# Patient Record
Sex: Female | Born: 1995 | Race: Black or African American | Hispanic: No | Marital: Single | Smoking: Never smoker
Health system: Southern US, Community
[De-identification: ages and names within clinical notes are randomized; demographics above are authoritative.]

## PROBLEM LIST (undated history)

## (undated) DIAGNOSIS — R319 Hematuria, unspecified: Secondary | ICD-10-CM

## (undated) DIAGNOSIS — L309 Dermatitis, unspecified: Secondary | ICD-10-CM

## (undated) DIAGNOSIS — Z803 Family history of malignant neoplasm of breast: Secondary | ICD-10-CM

## (undated) DIAGNOSIS — J45909 Unspecified asthma, uncomplicated: Secondary | ICD-10-CM

## (undated) DIAGNOSIS — Z87442 Personal history of urinary calculi: Secondary | ICD-10-CM

## (undated) DIAGNOSIS — N200 Calculus of kidney: Secondary | ICD-10-CM

## (undated) DIAGNOSIS — R109 Unspecified abdominal pain: Secondary | ICD-10-CM

## (undated) DIAGNOSIS — A749 Chlamydial infection, unspecified: Secondary | ICD-10-CM

## (undated) DIAGNOSIS — D369 Benign neoplasm, unspecified site: Secondary | ICD-10-CM

## (undated) DIAGNOSIS — Z23 Encounter for immunization: Secondary | ICD-10-CM

## (undated) HISTORY — DX: Chlamydial infection, unspecified: A74.9

## (undated) HISTORY — DX: Hematuria, unspecified: R31.9

## (undated) HISTORY — PX: LITHOTRIPSY: SUR834

## (undated) HISTORY — DX: Unspecified asthma, uncomplicated: J45.909

## (undated) HISTORY — DX: Calculus of kidney: N20.0

## (undated) HISTORY — DX: Dermatitis, unspecified: L30.9

## (undated) HISTORY — DX: Benign neoplasm, unspecified site: D36.9

## (undated) HISTORY — DX: Unspecified abdominal pain: R10.9

## (undated) HISTORY — PX: TYMPANOSTOMY TUBE PLACEMENT: SHX32

## (undated) HISTORY — DX: Family history of malignant neoplasm of breast: Z80.3

## (undated) HISTORY — DX: Encounter for immunization: Z23

---
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
## 2016-05-16 ENCOUNTER — Ambulatory Visit (INDEPENDENT_AMBULATORY_CARE_PROVIDER_SITE_OTHER): Payer: 59

## 2016-05-16 ENCOUNTER — Ambulatory Visit (INDEPENDENT_AMBULATORY_CARE_PROVIDER_SITE_OTHER): Payer: 59 | Admitting: Obstetrics and Gynecology

## 2016-05-16 ENCOUNTER — Other Ambulatory Visit: Payer: Self-pay | Admitting: Obstetrics and Gynecology

## 2016-05-16 ENCOUNTER — Encounter: Payer: Self-pay | Admitting: Obstetrics and Gynecology

## 2016-05-16 VITALS — BP 120/80 | HR 90 | Ht 65.0 in | Wt 142.0 lb

## 2016-05-16 DIAGNOSIS — Z3042 Encounter for surveillance of injectable contraceptive: Secondary | ICD-10-CM

## 2016-05-16 DIAGNOSIS — Z113 Encounter for screening for infections with a predominantly sexual mode of transmission: Secondary | ICD-10-CM

## 2016-05-16 DIAGNOSIS — Z01419 Encounter for gynecological examination (general) (routine) without abnormal findings: Secondary | ICD-10-CM | POA: Diagnosis not present

## 2016-05-16 MED ORDER — MEDROXYPROGESTERONE ACETATE 150 MG/ML IM SUSY: 150.0000 mg | PREFILLED_SYRINGE | Freq: Once | INTRAMUSCULAR | 3 refills | Status: DC

## 2016-05-16 MED ORDER — MEDROXYPROGESTERONE ACETATE 150 MG/ML IM SUSP
150.0000 mg | Freq: Once | INTRAMUSCULAR | Status: AC
  Administered 2016-05-16: 150 mg via INTRAMUSCULAR

## 2016-05-16 NOTE — Progress Notes (Signed)
Chief Complaint  Patient presents with  . Gynecologic Exam     HPI:      Kristen Duarte is a 21 y.o. G0P0000 who LMP was Patient's last menstrual period was 05/12/2016., presents today for her annual examination.  Her menses are absent due to depo. Dysmenorrhea mild, occurring randomly. She does have intermenstrual bleeding.  Sex activity: single partner, contraception - Depo-Provera injections.  Hx of STDs: none  There is no FH of breast cancer. There is no FH of ovarian cancer. The patient does not do self-breast exams.  Tobacco use: The patient denies current or previous tobacco use. Alcohol use: none Exercise: not active  She does not get adequate calcium and Vitamin D in her diet.  She has been having issues with kidney stones this yr. They are calcium based.   Past Medical History:  Diagnosis Date  . Asthma   . Chlamydia   . Dermoid cyst   . Eczema   . Family history of breast cancer   . Flank pain   . Hematuria    gross  . History of kidney stones    H/O  . Immunization, viral disease 03/03/2012   gardasil series completed  . Kidney stones     Past Surgical History:  Procedure Laterality Date  . LITHOTRIPSY    . TONSILLECTOMY  2003  . TYMPANOSTOMY TUBE PLACEMENT      Family History  Problem Relation Age of Onset  . Kidney Stones Mother   . Hypertension Mother   . Kidney Stones Father   . Kidney Stones Sister   . Breast cancer Maternal Grandmother 107  . Hypertension Maternal Grandmother   . Brain cancer Maternal Grandfather   . Cancer Maternal Grandfather        lung ca - smoker  . Brain cancer Paternal Aunt   . Breast cancer Other   . Breast cancer Other   . Breast cancer Other   . Hypertension Maternal Aunt   . Kidney disease Neg Hx     Social History   Social History  . Marital status: Single    Spouse name: N/A  . Number of children: 0  . Years of education: 12   Occupational History  . Not on file.   Social History Main  Topics  . Smoking status: Never Smoker  . Smokeless tobacco: Never Used  . Alcohol use No  . Drug use: No  . Sexual activity: Yes    Birth control/ protection: Injection   Other Topics Concern  . Not on file   Social History Narrative  . No narrative on file     Current Outpatient Prescriptions:  .  albuterol (PROVENTIL HFA;VENTOLIN HFA) 108 (90 Base) MCG/ACT inhaler, Inhale 2 puffs into the lungs every 6 (six) hours as needed for wheezing or shortness of breath., Disp: , Rfl:  .  EPIDUO FORTE 0.3-2.5 % GEL, 1 Dose daily. , Disp: , Rfl:  .  MedroxyPROGESTERone Acetate 150 MG/ML SUSY, Inject 1 mL (150 mg total) into the muscle once., Disp: 1 Syringe, Rfl: 3  ROS:  Review of Systems  Constitutional: Negative for fever, malaise/fatigue and weight loss.  HENT: Negative for congestion, ear pain and sinus pain.   Respiratory: Negative for cough, shortness of breath and wheezing.   Cardiovascular: Negative for chest pain, orthopnea and leg swelling.  Gastrointestinal: Negative for constipation, diarrhea, nausea and vomiting.  Genitourinary: Negative for dysuria, frequency, hematuria and urgency.  Breast ROS: negative   Musculoskeletal: Negative for back pain, joint pain and myalgias.  Skin: Negative for itching and rash.  Neurological: Negative for dizziness, tingling, focal weakness and headaches.  Endo/Heme/Allergies: Negative for environmental allergies. Does not bruise/bleed easily.  Psychiatric/Behavioral: Negative for depression and suicidal ideas. The patient is not nervous/anxious and does not have insomnia.     Objective: BP 120/80   Pulse 90   Ht 5\' 5"  (1.651 m)   Wt 142 lb (64.4 kg)   LMP 05/12/2016   BMI 23.63 kg/m    Physical Exam  Constitutional: She is oriented to person, place, and time. She appears well-developed and well-nourished.  Genitourinary: Vagina normal and uterus normal. There is no rash or tenderness on the right labia. There is no rash or  tenderness on the left labia. No erythema or tenderness in the vagina. No vaginal discharge found. Right adnexum does not display mass and does not display tenderness. Left adnexum does not display mass and does not display tenderness. Cervix does not exhibit motion tenderness or polyp. Uterus is not enlarged or tender.  Neck: Normal range of motion. No thyromegaly present.  Cardiovascular: Normal rate, regular rhythm and normal heart sounds.   No murmur heard. Pulmonary/Chest: Effort normal and breath sounds normal. Right breast exhibits no mass, no nipple discharge, no skin change and no tenderness. Left breast exhibits no mass, no nipple discharge, no skin change and no tenderness.  Abdominal: Soft. There is no tenderness. There is no guarding.  Musculoskeletal: Normal range of motion.  Neurological: She is alert and oriented to person, place, and time. No cranial nerve deficit.  Psychiatric: She has a normal mood and affect. Her behavior is normal.  Vitals reviewed.   Assessment/Plan: Encounter for annual routine gynecological examination  Screening for STD (sexually transmitted disease) - Plan: Chlamydia/Gonococcus/Trichomonas, NAA  Encounter for surveillance of injectable contraceptive - Depo RF eRxd. Increase calcium in food sources. Pt may want to change BC, but not now. Will call when desires change. - Plan: MedroxyPROGESTERone Acetate 150 MG/ML SUSY             GYN counsel adequate intake of calcium and vitamin D, diet and exercise     F/U  Return in about 1 year (around 05/16/2017).  Alicia B. Copland, PA-C 05/16/2016 3:17 PM

## 2016-05-18 LAB — CHLAMYDIA/GONOCOCCUS/TRICHOMONAS, NAA
Chlamydia by NAA: NEGATIVE
GONOCOCCUS BY NAA: NEGATIVE
Trich vag by NAA: NEGATIVE

## 2016-08-07 ENCOUNTER — Ambulatory Visit (INDEPENDENT_AMBULATORY_CARE_PROVIDER_SITE_OTHER): Payer: 59

## 2016-08-07 DIAGNOSIS — Z308 Encounter for other contraceptive management: Secondary | ICD-10-CM

## 2016-08-07 MED ORDER — MEDROXYPROGESTERONE ACETATE 150 MG/ML IM SUSP
150.0000 mg | Freq: Once | INTRAMUSCULAR | Status: AC
Start: 2016-08-07 — End: 2016-08-07
  Administered 2016-08-07: 150 mg via INTRAMUSCULAR

## 2016-08-07 NOTE — Progress Notes (Signed)
Pt here for depo which was given IM Left deltoid.  [place] # [phone]

## 2016-10-30 ENCOUNTER — Ambulatory Visit: Payer: 59

## 2016-11-04 ENCOUNTER — Ambulatory Visit (INDEPENDENT_AMBULATORY_CARE_PROVIDER_SITE_OTHER): Payer: 59

## 2016-11-04 DIAGNOSIS — Z308 Encounter for other contraceptive management: Secondary | ICD-10-CM

## 2016-11-04 MED ORDER — MEDROXYPROGESTERONE ACETATE 150 MG/ML IM SUSP
150.0000 mg | Freq: Once | INTRAMUSCULAR | Status: AC
  Administered 2016-11-04: 150 mg via INTRAMUSCULAR

## 2016-11-04 NOTE — Progress Notes (Signed)
Pt here for depo which was given IM left deltoid.  [phone]

## 2017-01-27 ENCOUNTER — Ambulatory Visit: Payer: 59

## 2017-01-28 ENCOUNTER — Ambulatory Visit (INDEPENDENT_AMBULATORY_CARE_PROVIDER_SITE_OTHER): Payer: Managed Care, Other (non HMO)

## 2017-01-28 DIAGNOSIS — Z309 Encounter for contraceptive management, unspecified: Secondary | ICD-10-CM

## 2017-01-28 MED ORDER — MEDROXYPROGESTERONE ACETATE 150 MG/ML IM SUSP
150.0000 mg | Freq: Once | INTRAMUSCULAR | Status: AC
  Administered 2017-01-28: 150 mg via INTRAMUSCULAR

## 2017-04-22 ENCOUNTER — Ambulatory Visit: Payer: Managed Care, Other (non HMO)

## 2018-01-16 IMAGING — US US RENAL
1 series · 14 of 25 positions shown · non-contrast
Comparison: Abdominal radiograph 07/05/2015

CLINICAL DATA: Patient with right flank pain for 1 month. Gross
hematuria.

EXAM:
RENAL / URINARY TRACT ULTRASOUND COMPLETE

[Series 1: us renal · 0.26mm/px · 14 of 53 slices shown]
[im 1/53]
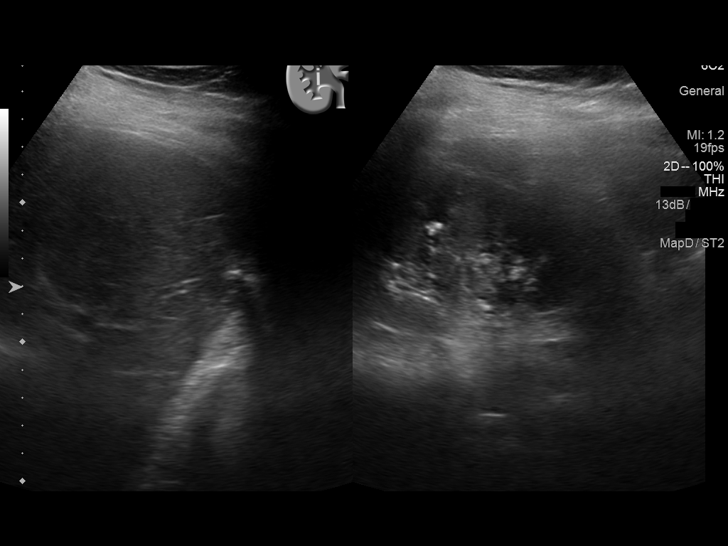
[im 5/53]
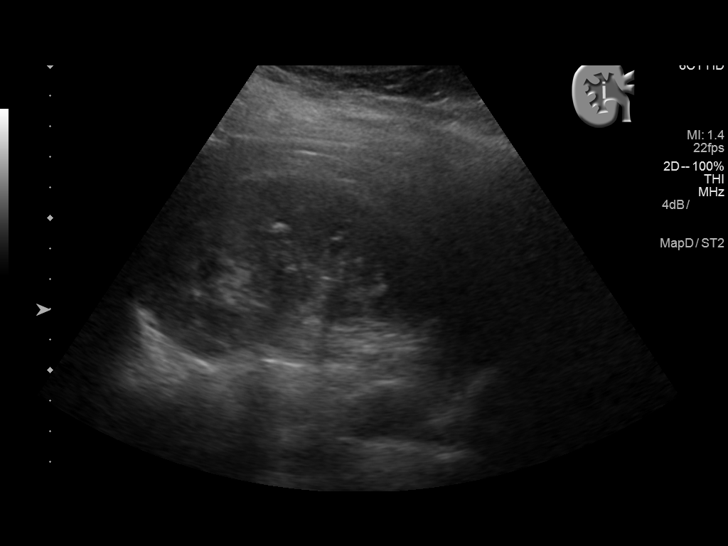
[im 9/53]
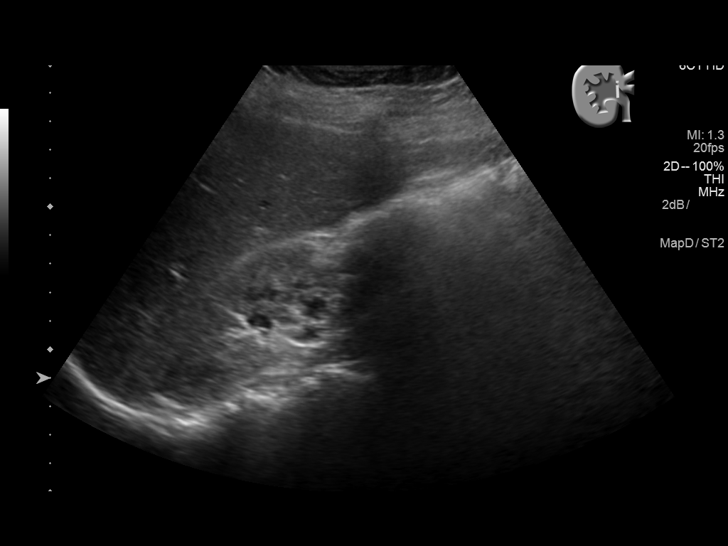
[im 14/53]
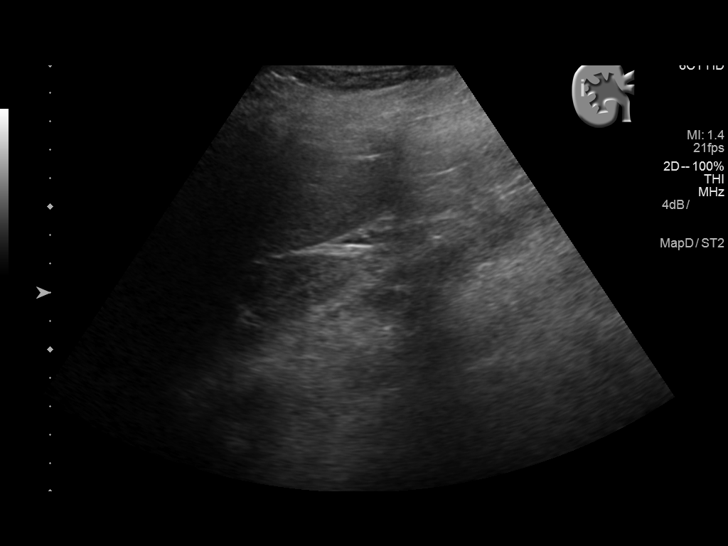
[im 18/53]
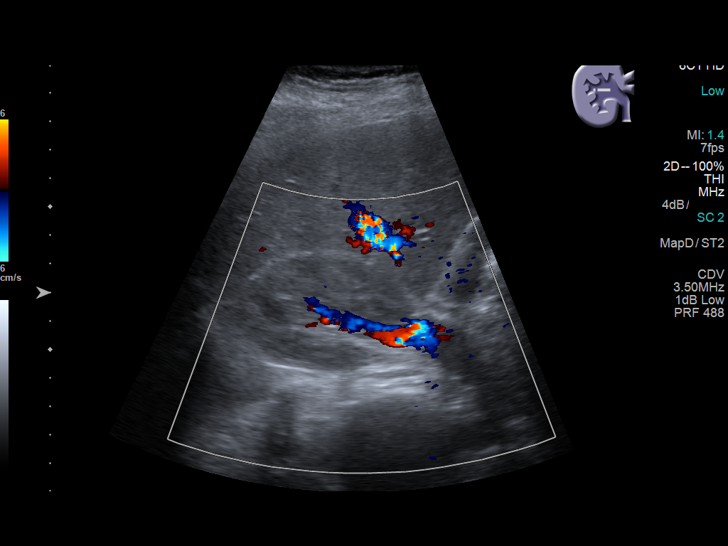
[im 20/53]
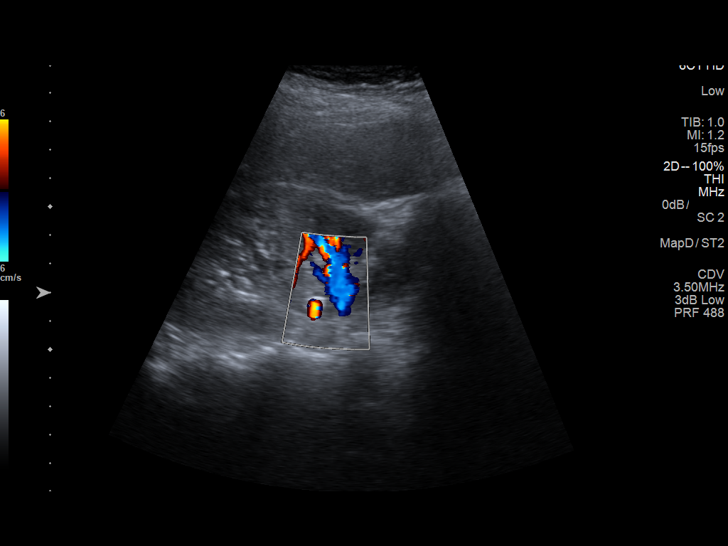
[im 24/53]
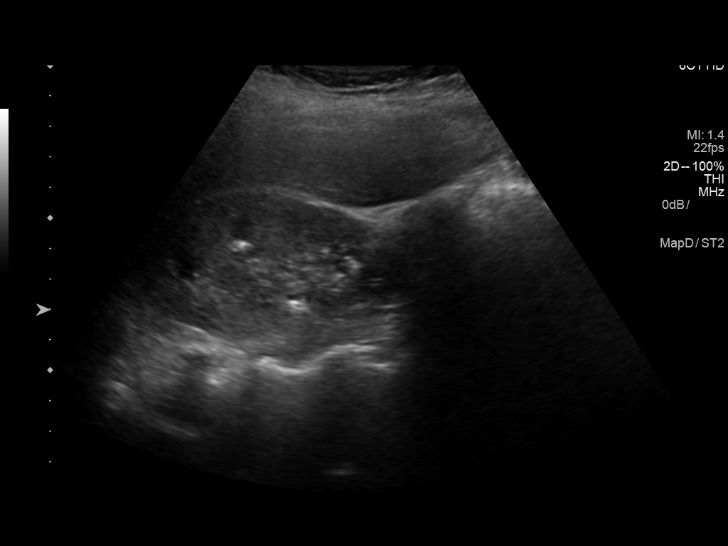
[im 29/53]
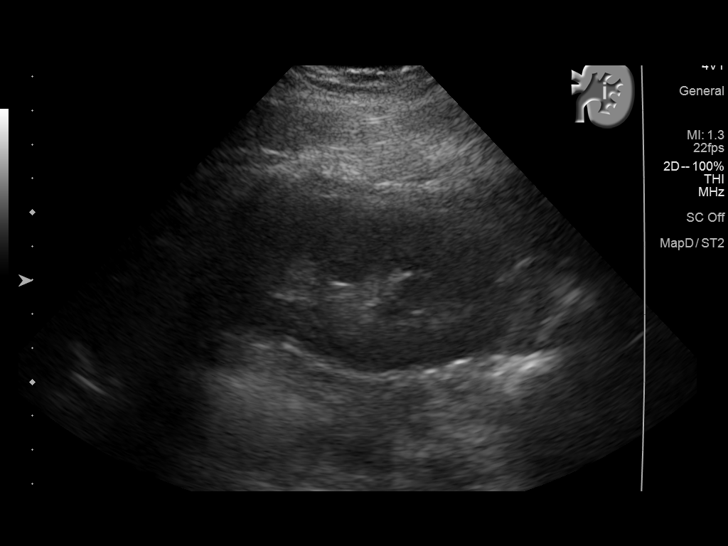
[im 33/53]
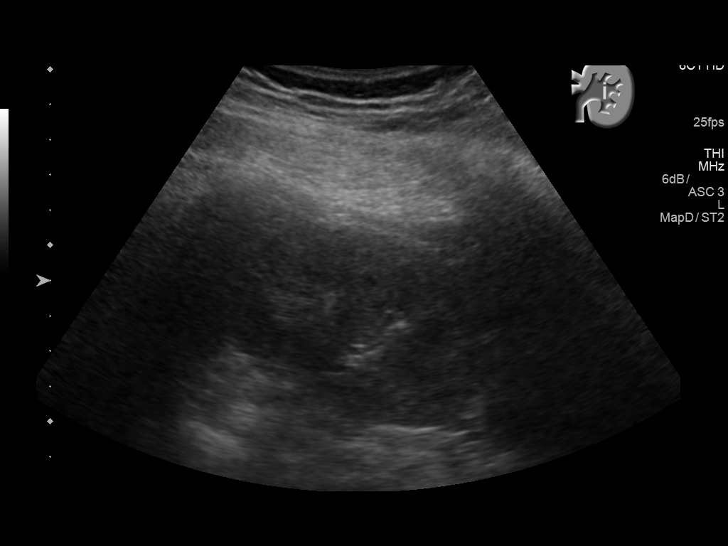
[im 35/53]
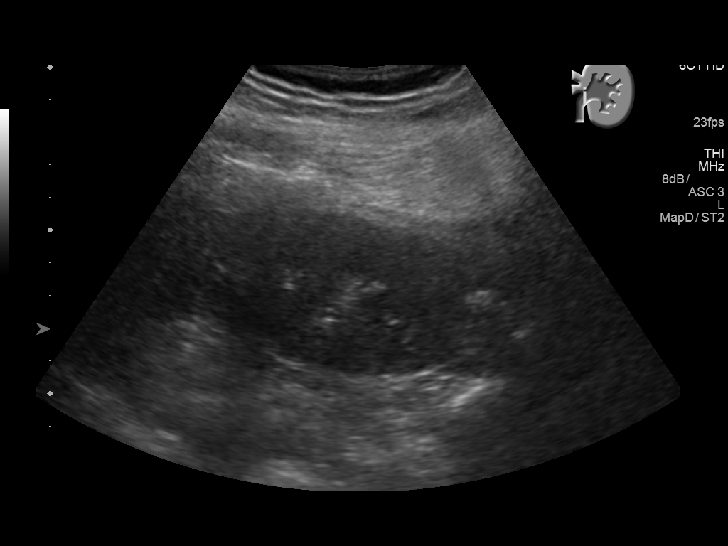
[im 40/53]
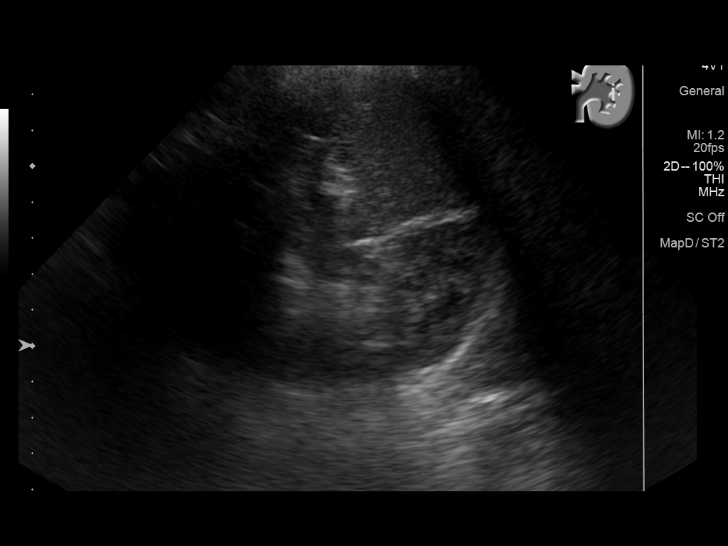
[im 44/53]
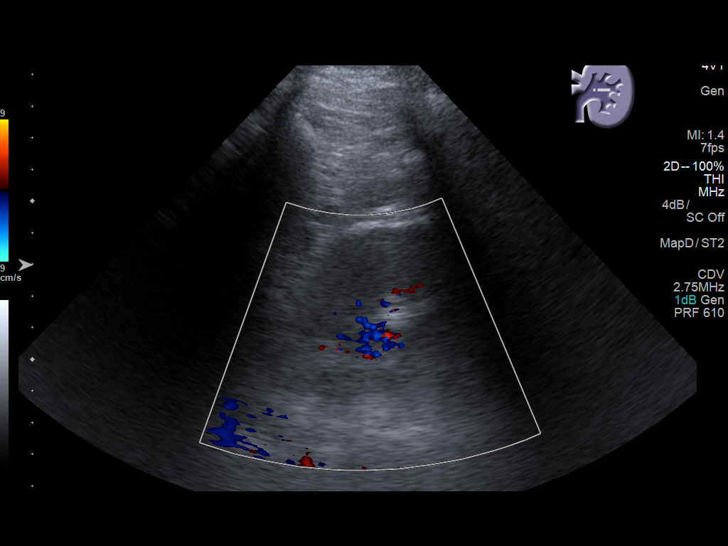
[im 48/53]
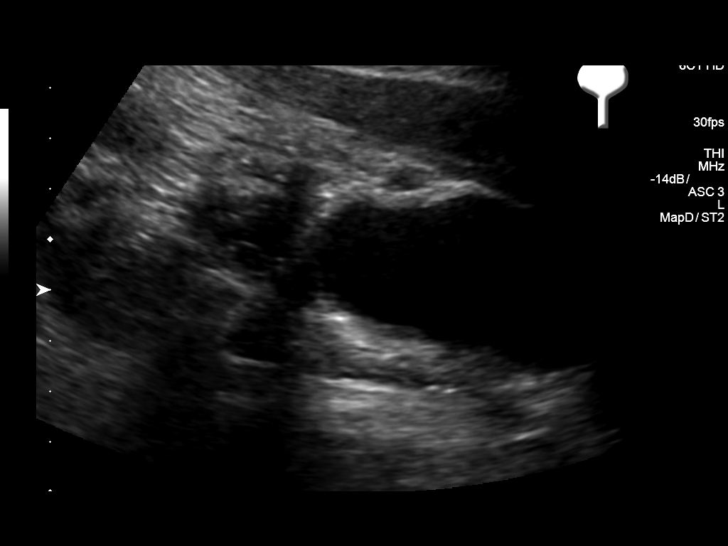
[im 53/53]
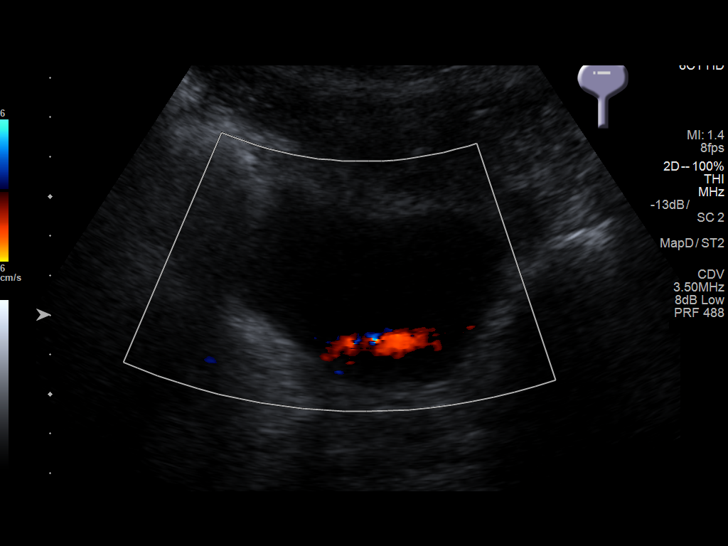

[14 of 25 positions shown; findings below may reference images not displayed]

FINDINGS: Right Kidney:

Length: 10.4 cm. Normal renal cortical thickness and echogenicity.
No hydronephrosis. There is a 9 mm echogenic focus suggestive of
stone measuring 9 mm.

Left Kidney:

Length: 9.8 cm. Echogenicity within normal limits. No mass or
hydronephrosis visualized.

Bladder:

Appears normal for degree of bladder distention.
IMPRESSION: No hydronephrosis.

Right-sided nephrolithiasis.

## 2018-01-22 DIAGNOSIS — N201 Calculus of ureter: Secondary | ICD-10-CM | POA: Insufficient documentation

## 2018-01-25 NOTE — Progress Notes (Deleted)
01/26/2018 8:03 PM   Kristen Duarte [birth year] [id number]  Referring provider: No referring provider defined for this encounter.  No chief complaint on file.   HPI: Patient is a 23 year old *** who was seen by [facility] urgent [facility] for nephrolithiasis.    On 01/22/2018, she presented to the urgent care for 6/10 bilateral flank pain that radiated to the lower abdomen and bladder.  CT Renal Stone study revealed LEFT ureteral 4 mm calculus with mild proximal ureteral dilatation.  Nonobstructing nephrolithiasis on the RIGHT.   Calcified lesion in the RIGHT adnexa may be related to the ovary. Further evaluation with a dedicated pelvic ultrasound may be considered if clinically indicated.   Labs in the ED:  Urine dip was positive for blood.     Meds given in the ED: hydrocodone/APAP 5/325  Prior urological history:  [facility] 2016   Current NSAID/anticoagulation:   ***   Today: ***    PMH: Past Medical History:  Diagnosis Date  . Asthma   . Chlamydia   . Dermoid cyst   . Eczema   . Family history of breast cancer   . Flank pain   . Hematuria    gross  . History of kidney stones    H/O  . Immunization, viral disease 03/03/2012   gardasil series completed  . Kidney stones     Surgical History: Past Surgical History:  Procedure Laterality Date  . LITHOTRIPSY    . TONSILLECTOMY  2003  . TYMPANOSTOMY TUBE PLACEMENT      Home Medications:  Allergies as of 01/26/2018   No Known Allergies     Medication List       Accurate as of January 25, 2018  8:03 PM. Always use your most recent med list.        albuterol 108 (90 Base) MCG/ACT inhaler Commonly known as:  PROVENTIL HFA;VENTOLIN HFA Inhale 2 puffs into the lungs every 6 (six) hours as needed for wheezing or shortness of breath.   EPIDUO FORTE 0.3-2.5 % Gel Generic drug:  Adapalene-Benzoyl Peroxide 1 Dose daily.   medroxyPROGESTERone Acetate 150 MG/ML Susy Inject 1 mL (150 mg total) into the muscle once.        Allergies: No Known Allergies  Family History: Family History  Problem Relation Age of Onset  . Kidney Stones Mother   . Hypertension Mother   . Kidney Stones Father   . Kidney Stones Sister   . Breast cancer Maternal Grandmother 69  . Hypertension Maternal Grandmother   . Brain cancer Maternal Grandfather   . Cancer Maternal Grandfather        lung ca - smoker  . Brain cancer Paternal Aunt   . Breast cancer Other   . Breast cancer Other   . Breast cancer Other   . Hypertension Maternal Aunt   . Kidney disease Neg Hx     Social History:  reports that she has never smoked. She has never used smokeless tobacco. She reports that she does not drink alcohol or use drugs.  ROS:                                        Physical Exam: There were no vitals taken for this visit.  Constitutional:  Well nourished. Alert and oriented, No acute distress. HEENT: [place] AT, moist mucus membranes.  Trachea midline, no masses. Cardiovascular: No clubbing, cyanosis, or  edema. Respiratory: Normal respiratory effort, no increased work of breathing. GI: Abdomen is soft, non tender, non distended, no abdominal masses. Liver and spleen not palpable.  No hernias appreciated.  Stool sample for occult testing is not indicated.   GU: No CVA tenderness.  No bladder fullness or masses.  *** external genitalia, *** pubic hair distribution, no lesions.  Normal urethral meatus, no lesions, no prolapse, no discharge.   No urethral masses, tenderness and/or tenderness. No bladder fullness, tenderness or masses. *** vagina mucosa, *** estrogen effect, no discharge, no lesions, *** pelvic support, *** cystocele and *** rectocele noted.  No cervical motion tenderness.  Uterus is freely mobile and non-fixed.  No adnexal/parametria masses or tenderness noted.  Anus and perineum are without rashes or lesions.   ***  Skin: No rashes, bruises or suspicious lesions. Lymph: No cervical or inguinal  adenopathy. Neurologic: Grossly intact, no focal deficits, moving all 4 extremities. Psychiatric: Normal mood and affect.   Laboratory Data: Lab Results  Component Value Date   WBC 6.4 10/23/2015   HGB 12.1 10/23/2015   HCT 35.7 10/23/2015   MCV 86.2 10/23/2015   PLT 188 10/23/2015    Lab Results  Component Value Date   CREATININE 0.70 10/23/2015    No results found for: PSA  No results found for: TESTOSTERONE  No results found for: HGBA1C  No results found for: TSH  No results found for: CHOL, HDL, CHOLHDL, VLDL, LDLCALC  Lab Results  Component Value Date   AST 24 10/23/2015   Lab Results  Component Value Date   ALT 18 10/23/2015   No components found for: ALKALINEPHOPHATASE No components found for: BILIRUBINTOTAL  No results found for: ESTRADIOL  Urinalysis *** I have reviewed the labs.   Pertinent Imaging: Interface, Imaging Results In - 01/22/2018  5:07 PM EST CT ABDOMEN AND PELVIS, RENAL COLIC PROTOCOL:  HISTORY: Unspecified abdominal pain Gross hematuria  COMPARISON: None  CONTRAST: None   TECHNIQUE: Spiral CT performed from the dome of the diaphragm to the pubic symphysis without intravenous or oral contrast.   DICOM format image data available to non-affiliated external healthcare facilities or entities on a secure, media free, reciprocally searchable basis with patient authorization for at least a 12 month period after the study.  Dose reduction technique was used on this scan by utilizing automated exposure control, adjustment of the mA and/or kV according to the patient size.  DLP:  225 mGy-cm.  FINDINGS:   Scout Images: RIGHT nephrolithiasis is noted on the scout images.  GU: Kidneys are symmetric in size without significant hydronephrosis. Multiple renal calculi are noted throughout the RIGHT kidney. A representative calculus in the RIGHT lower pole measures up to 4 mm in diameter. No definite evidence of nephrolithiasis on the LEFT.  A 4 mm calculus is noted in the proximal LEFT ureter with mild dilatation of the ureter. Normal adrenal glands. No abnormality identified in the bladder. The uterus is anteverted and unremarkable. Calcified RIGHT adnexal mass is noted measuring 1.7 x 1.6 cm in diameter.   Lung bases: No significant opacities or pleural fluid.  Hepatobiliary: No focal hepatic lesion or intrahepatic biliary dilatation. Pancreas normal morphology and duct caliber. The gallbladder is not distended. No radiopaque gallstones or evidence of acute cholecystitis. Normal splenic parenchyma.  GI: No evidence for bowel obstruction. No mesenteric stranding or wall thickening. No intraperitoneal adenopathy, masses or fluid. Prominent fecal load is noted throughout the colon. The appendix is normal in caliber. The appendix  is noted abutting a calcified RIGHT adnexal lesion measuring 2.3 x 10.1 cm in diameter. No significant periappendiceal or adnexal inflammatory changes. Retroperitoneum: No retroperitoneal adenopathy.  Cardiovascular: Normal caliber aorta.  Musculoskeletal: No focal lytic or blastic lesion. No fracture or acute derangement.   IMPRESSION: 1.  LEFT ureteral 4 mm calculus with mild proximal ureteral dilatation.  2.  Nonobstructing nephrolithiasis on the RIGHT.  3.  Calcified lesion in the RIGHT adnexa may be related to the ovary. Further evaluation with a dedicated pelvic ultrasound may be considered if clinically indicated.   I have independently reviewed the films.    Assessment & Plan:  ***  1. Left ureteral stone***  - stone sent for analysis ***  2. Left hydronephrosis***  - obtain RUS to ensure the hydronephrosis has resolved once they have passed and/or recovered from procedure to ensure to iatrogenic hydronephrosis remains - it is explained to the patient that it is important to document resolution of the hydronephrosis as "silent hydronephrosis" can occur and cause damage and/or loss of the  kidney  3. *** hematuria  - UA today demonstrates ***  - continue to monitor the patient's UA after the treatment/passage of the stone to ensure the hematuria has resolved  - if hematuria persists, we will pursue a hematuria workup with CT Urogram and cystoscopy if appropriate.  4. Dermoid cyst       No follow-ups on file.  These notes generated with voice recognition software. I apologize for typographical errors.  Zara Council, PA-C  [facility] [address]  [place], [place] [address] [phone]

## 2018-01-26 ENCOUNTER — Ambulatory Visit: Payer: 59 | Admitting: Urology

## 2018-02-20 ENCOUNTER — Telehealth: Payer: Self-pay | Admitting: Urology

## 2018-02-20 ENCOUNTER — Other Ambulatory Visit: Payer: Self-pay | Admitting: Urology

## 2018-02-20 DIAGNOSIS — R319 Hematuria, unspecified: Secondary | ICD-10-CM

## 2018-02-20 DIAGNOSIS — N201 Calculus of ureter: Secondary | ICD-10-CM

## 2018-02-20 NOTE — Progress Notes (Signed)
KUB orders in for [place].

## 2018-02-20 NOTE — Telephone Encounter (Signed)
I called pt and left message that she would need a KUB prior to her appt in [place] on Monday.  Also, Larene Beach would like a copy of her disk from [facility] if possible.

## 2018-02-22 NOTE — Progress Notes (Signed)
02/23/2018 2:37 PM   Kristen Duarte 1995-07-04 [id number]  Referring provider: No referring provider defined for this encounter.  Chief Complaint  Patient presents with  . Follow-up    KUB results    HPI: Patient is a 23 year old African American who was seen by [facility] urgent [facility] for nephrolithiasis.    On 01/22/2018, she presented to the urgent care for 6/10 bilateral flank pain that radiated to the lower abdomen and bladder.  CT Renal Stone study revealed LEFT ureteral 4 mm calculus with mild proximal ureteral dilatation.  Nonobstructing nephrolithiasis on the RIGHT.   Calcified lesion in the RIGHT adnexa may be related to the ovary. Further evaluation with a dedicated pelvic ultrasound may be considered if clinically indicated.   Labs in the ED:  Urine dip was positive for blood.     Meds given in the ED: hydrocodone/APAP 5/325  Prior urological history:  ESWL 2016   Current NSAID/anticoagulation:   Toradol IM at [facility] on 01/22/2018.     Today, she is still experiencing intermittent gross hematuria and now the pain is located in the suprapubic area rather than the left flank.   She states the pain wakes her at night and causes her to have urgency to urinate.  She is using the strainer occasionally, but she has not passed a stone fragment.  Patient denies any gross hematuria, dysuria or suprapubic/flank pain.  Patient denies any fevers, chills, nausea or vomiting.   KUB today (02/23/2018) noted two possible stones in the upper pole the right kidney.  No evidence of a ureteral stone.  No acute findings.  Mild generalized increased colonic stool burden.  Dermoid cyst in the left lower quadrant.    PMH: Past Medical History:  Diagnosis Date  . Asthma   . Chlamydia   . Dermoid cyst   . Eczema   . Family history of breast cancer   . Flank pain   . Hematuria    gross  . History of kidney stones    H/O  . Immunization, viral disease 03/03/2012   gardasil series  completed  . Kidney stones     Surgical History: Past Surgical History:  Procedure Laterality Date  . LITHOTRIPSY    . TONSILLECTOMY  2003  . TYMPANOSTOMY TUBE PLACEMENT      Home Medications:  Allergies as of 02/23/2018   No Known Allergies     Medication List       Accurate as of February 23, 2018  2:37 PM. Always use your most recent med list.        albuterol 108 (90 Base) MCG/ACT inhaler Commonly known as:  PROVENTIL HFA;VENTOLIN HFA Inhale 2 puffs into the lungs every 6 (six) hours as needed for wheezing or shortness of breath.   EPIDUO FORTE 0.3-2.5 % Gel Generic drug:  Adapalene-Benzoyl Peroxide 1 Dose daily.       Allergies: No Known Allergies  Family History: Family History  Problem Relation Age of Onset  . Kidney Stones Mother   . Hypertension Mother   . Kidney Stones Father   . Kidney Stones Sister   . Breast cancer Maternal Grandmother 26  . Hypertension Maternal Grandmother   . Brain cancer Maternal Grandfather   . Cancer Maternal Grandfather        lung ca - smoker  . Brain cancer Paternal Aunt   . Breast cancer Other   . Breast cancer Other   . Breast cancer Other   . Hypertension  Maternal Aunt   . Kidney disease Neg Hx     Social History:  reports that she has never smoked. She has never used smokeless tobacco. She reports that she does not drink alcohol or use drugs.  ROS: UROLOGY Frequent Urination?: No Hard to postpone urination?: No Burning/pain with urination?: No Get up at night to urinate?: Yes Leakage of urine?: No Urine stream starts and stops?: No Trouble starting stream?: No Do you have to strain to urinate?: No Blood in urine?: Yes Urinary tract infection?: No Sexually transmitted disease?: No Injury to kidneys or bladder?: No Painful intercourse?: No Weak stream?: No Currently pregnant?: No Vaginal bleeding?: No Last menstrual period?: n  Gastrointestinal Nausea?: No Vomiting?: No Indigestion/heartburn?:  No Diarrhea?: No Constipation?: No  Constitutional Fever: No Night sweats?: Yes Weight loss?: No Fatigue?: No  Skin Skin rash/lesions?: No Itching?: No  Eyes Blurred vision?: No Double vision?: No  Ears/Nose/Throat Sore throat?: No Sinus problems?: No  Hematologic/Lymphatic Swollen glands?: No Easy bruising?: No  Cardiovascular Leg swelling?: No Chest pain?: No  Respiratory Cough?: No Shortness of breath?: No  Endocrine Excessive thirst?: Yes  Musculoskeletal Back pain?: Yes Joint pain?: No  Neurological Headaches?: No Dizziness?: No  Psychologic Depression?: No Anxiety?: No  Physical Exam: BP 116/82   Pulse 67   Temp (!) 97 F (36.1 C) (Oral)   Resp 16   Ht '5\' 5"'$  (1.651 m)   Wt 150 lb (68 kg)   LMP 02/22/2018 Comment: signed waiver  BMI 24.96 kg/m   Constitutional:  Well nourished. Alert and oriented, No acute distress. HEENT: [place] AT, moist mucus membranes.  Trachea midline, no masses. Cardiovascular: No clubbing, cyanosis, or edema. Respiratory: Normal respiratory effort, no increased work of breathing. GI: Abdomen is soft, non tender, non distended, no abdominal masses.  GU: No CVA tenderness.  No bladder fullness or masses.  Skin: No rashes, bruises or suspicious lesions. Neurologic: Grossly intact, no focal deficits, moving all 4 extremities. Psychiatric: Normal mood and affect.   Laboratory Data: Lab Results  Component Value Date   WBC 6.4 10/23/2015   HGB 12.1 10/23/2015   HCT 35.7 10/23/2015   MCV 86.2 10/23/2015   PLT 188 10/23/2015    Lab Results  Component Value Date   CREATININE 0.70 10/23/2015    No results found for: PSA  No results found for: TESTOSTERONE  No results found for: HGBA1C  No results found for: TSH  No results found for: CHOL, HDL, CHOLHDL, VLDL, LDLCALC  Lab Results  Component Value Date   AST 24 10/23/2015   Lab Results  Component Value Date   ALT 18 10/23/2015   No components found  for: ALKALINEPHOPHATASE No components found for: BILIRUBINTOTAL  No results found for: ESTRADIOL  I have reviewed the labs.   Pertinent Imaging: Interface, Imaging Results In - 01/22/2018  5:07 PM EST CT ABDOMEN AND PELVIS, RENAL COLIC PROTOCOL:  HISTORY: Unspecified abdominal pain Gross hematuria  COMPARISON: None  CONTRAST: None   TECHNIQUE: Spiral CT performed from the dome of the diaphragm to the pubic symphysis without intravenous or oral contrast.   DICOM format image data available to non-affiliated external healthcare facilities or entities on a secure, media free, reciprocally searchable basis with patient authorization for at least a 12 month period after the study.  Dose reduction technique was used on this scan by utilizing automated exposure control, adjustment of the mA and/or kV according to the patient size.  DLP:  225 mGy-cm.  FINDINGS:   Scout Images: RIGHT nephrolithiasis is noted on the scout images.  GU: Kidneys are symmetric in size without significant hydronephrosis. Multiple renal calculi are noted throughout the RIGHT kidney. A representative calculus in the RIGHT lower pole measures up to 4 mm in diameter. No definite evidence of nephrolithiasis on the LEFT. A 4 mm calculus is noted in the proximal LEFT ureter with mild dilatation of the ureter. Normal adrenal glands. No abnormality identified in the bladder. The uterus is anteverted and unremarkable. Calcified RIGHT adnexal mass is noted measuring 1.7 x 1.6 cm in diameter.   Lung bases: No significant opacities or pleural fluid.  Hepatobiliary: No focal hepatic lesion or intrahepatic biliary dilatation. Pancreas normal morphology and duct caliber. The gallbladder is not distended. No radiopaque gallstones or evidence of acute cholecystitis. Normal splenic parenchyma.  GI: No evidence for bowel obstruction. No mesenteric stranding or wall thickening. No intraperitoneal adenopathy, masses or fluid.  Prominent fecal load is noted throughout the colon. The appendix is normal in caliber. The appendix is noted abutting a calcified RIGHT adnexal lesion measuring 2.3 x 10.1 cm in diameter. No significant periappendiceal or adnexal inflammatory changes. Retroperitoneum: No retroperitoneal adenopathy.  Cardiovascular: Normal caliber aorta.  Musculoskeletal: No focal lytic or blastic lesion. No fracture or acute derangement.   IMPRESSION: 1.  LEFT ureteral 4 mm calculus with mild proximal ureteral dilatation.  2.  Nonobstructing nephrolithiasis on the RIGHT.  3.  Calcified lesion in the RIGHT adnexa may be related to the ovary. Further evaluation with a dedicated pelvic ultrasound may be considered if clinically indicated.  I do not have access to the images and patient is advised to contact [place] for a copy of the images.   CLINICAL DATA:  Left-sided flank pain 2 weeks.  Hematuria.  EXAM: ABDOMEN - 1 VIEW  COMPARISON:  Renal ultrasound, 08/17/2015.  CT, 01/19/2014.  FINDINGS: Normal bowel gas pattern. Mild generalized increased colonic stool burden.  Two possible small stones project in the expected location of the upper pole the right kidney. There is no other evidence of a renal stone. No ureteral stone. Calcifications in the left pelvis are stable from the prior CT.  Soft tissues are otherwise unremarkable.  No skeletal abnormality.  IMPRESSION: 1. Two possible stones in the upper pole the right kidney. 2. No evidence of a ureteral stone.  No acute findings. 3. Mild generalized increased colonic stool burden.   Electronically Signed   By: Lajean Manes M.D.   On: 02/23/2018 14:07  I have independently reviewed the films and do not note a left ureteral distal stone, but it may be behind the dermoid cyst.     Assessment & Plan:    1. Left ureteral stone Explained to the patient that since their stone is  ?10 mm, it is in within AUA Guidelines to continue MET  with tamsulosin and pushing fluids for 4 to 6 weeks - she will continue MET at this time and return next week for KUB and office visit Patient is advised that if they should start to experience pain that is not able to be controlled with pain medication, intractable nausea and/or vomiting and/or fevers greater than 103 or shaking chills to contact the office immediately or seek treatment in the emergency department for emergent intervention.    2. Left hydronephrosis - obtain RUS to ensure the hydronephrosis has resolved once they have passed and/or recovered from procedure to ensure to iatrogenic hydronephrosis remains - it is explained  to the patient that it is important to document resolution of the hydronephrosis as "silent hydronephrosis" can occur and cause damage and/or loss of the kidney  3. Gross hematuria - continue to monitor the patient's UA after the treatment/passage of the stone to ensure the hematuria has resolved - if hematuria persists, we will pursue a hematuria workup with CT Urogram and cystoscopy if appropriate.  4. Dermoid cyst  -has not had recent follow up for the cyst    Return in about 1 week (around 03/02/2018) for KUB and office visit .  These notes generated with voice recognition software. I apologize for typographical errors.  Zara Council, PA-C  [facility] [address]  [place], [place] [address] [phone]

## 2018-02-23 ENCOUNTER — Ambulatory Visit
Admission: RE | Admit: 2018-02-23 | Discharge: 2018-02-23 | Disposition: A | Discharge status: Home / Self Care | Payer: Managed Care, Other (non HMO) | Attending: Urology | Admitting: Urology

## 2018-02-23 ENCOUNTER — Other Ambulatory Visit: Payer: Self-pay

## 2018-02-23 ENCOUNTER — Ambulatory Visit
Admission: RE | Admit: 2018-02-23 | Discharge: 2018-02-23 | Disposition: A | Discharge status: Home / Self Care | Payer: Managed Care, Other (non HMO) | Source: Ambulatory Visit | Attending: Urology | Admitting: Urology

## 2018-02-23 ENCOUNTER — Encounter: Payer: Self-pay | Admitting: Urology

## 2018-02-23 ENCOUNTER — Ambulatory Visit (INDEPENDENT_AMBULATORY_CARE_PROVIDER_SITE_OTHER): Payer: Managed Care, Other (non HMO) | Admitting: Urology

## 2018-02-23 ENCOUNTER — Telehealth: Payer: Self-pay

## 2018-02-23 VITALS — BP 116/82 | HR 67 | Temp 97.0°F | Resp 16 | Ht 65.0 in | Wt 150.0 lb

## 2018-02-23 DIAGNOSIS — R310 Gross hematuria: Secondary | ICD-10-CM | POA: Diagnosis not present

## 2018-02-23 DIAGNOSIS — N201 Calculus of ureter: Secondary | ICD-10-CM | POA: Diagnosis not present

## 2018-02-23 DIAGNOSIS — D369 Benign neoplasm, unspecified site: Secondary | ICD-10-CM | POA: Diagnosis not present

## 2018-02-23 DIAGNOSIS — N132 Hydronephrosis with renal and ureteral calculous obstruction: Secondary | ICD-10-CM

## 2018-02-23 NOTE — Telephone Encounter (Signed)
Left message for patient to have KUB prior to seeing shannon this morning.

## 2018-03-01 NOTE — Progress Notes (Deleted)
03/02/2018 7:24 PM   Kristen Duarte [birth year] [id number]  Referring provider: No referring provider defined for this encounter.  No chief complaint on file.   HPI: Patient is a 23 year old African American female who was seen by [facility] for nephrolithiasis who presents today for a follow up KUB for a left ureteral stone.    Background history  On 01/22/2018, she presented to the urgent care for 6/10 bilateral flank pain that radiated to the lower abdomen and bladder.  CT Renal Stone study revealed LEFT ureteral 4 mm calculus with mild proximal ureteral dilatation.  Nonobstructing nephrolithiasis on the RIGHT.   Calcified lesion in the RIGHT adnexa may be related to the ovary. Further evaluation with a dedicated pelvic ultrasound may be considered if clinically indicated.  Labs in the ED:  Urine dip was positive for blood.    Meds given in the ED: hydrocodone/APAP 5/325 Prior urological history:  ESWL 2016  Current NSAID/anticoagulation:   Toradol IM at [facility] on 01/22/2018.     KUB 02/23/2018 noted two possible stones in the upper pole the right kidney.  No evidence of a ureteral stone.  No acute findings.  Mild generalized increased colonic stool burden.  Dermoid cyst in the left lower quadrant.    PMH: Past Medical History:  Diagnosis Date  . Asthma   . Chlamydia   . Dermoid cyst   . Eczema   . Family history of breast cancer   . Flank pain   . Hematuria    gross  . History of kidney stones    H/O  . Immunization, viral disease 03/03/2012   gardasil series completed  . Kidney stones     Surgical History: Past Surgical History:  Procedure Laterality Date  . LITHOTRIPSY    . TONSILLECTOMY  2003  . TYMPANOSTOMY TUBE PLACEMENT      Home Medications:  Allergies as of 03/02/2018   No Known Allergies     Medication List       Accurate as of March 01, 2018  7:24 PM. Always use your most recent med list.        albuterol 108 (90 Base) MCG/ACT  inhaler Commonly known as:  PROVENTIL HFA;VENTOLIN HFA Inhale 2 puffs into the lungs every 6 (six) hours as needed for wheezing or shortness of breath.   EPIDUO FORTE 0.3-2.5 % Gel Generic drug:  Adapalene-Benzoyl Peroxide 1 Dose daily.       Allergies: No Known Allergies  Family History: Family History  Problem Relation Age of Onset  . Kidney Stones Mother   . Hypertension Mother   . Kidney Stones Father   . Kidney Stones Sister   . Breast cancer Maternal Grandmother 32  . Hypertension Maternal Grandmother   . Brain cancer Maternal Grandfather   . Cancer Maternal Grandfather        lung ca - smoker  . Brain cancer Paternal Aunt   . Breast cancer Other   . Breast cancer Other   . Breast cancer Other   . Hypertension Maternal Aunt   . Kidney disease Neg Hx     Social History:  reports that she has never smoked. She has never used smokeless tobacco. She reports that she does not drink alcohol or use drugs.  ROS:  Physical Exam: LMP 02/22/2018 Comment: signed waiver  Constitutional:  Well nourished. Alert and oriented, No acute distress. HEENT: [place] AT, moist mucus membranes.  Trachea midline, no masses. Cardiovascular: No clubbing, cyanosis, or edema. Respiratory: Normal respiratory effort, no increased work of breathing. GI: Abdomen is soft, non tender, non distended, no abdominal masses. Liver and spleen not palpable.  No hernias appreciated.  Stool sample for occult testing is not indicated.   GU: No CVA tenderness.  No bladder fullness or masses.  *** external genitalia, *** pubic hair distribution, no lesions.  Normal urethral meatus, no lesions, no prolapse, no discharge.   No urethral masses, tenderness and/or tenderness. No bladder fullness, tenderness or masses. *** vagina mucosa, *** estrogen effect, no discharge, no lesions, *** pelvic support, *** cystocele and *** rectocele noted.  No cervical motion  tenderness.  Uterus is freely mobile and non-fixed.  No adnexal/parametria masses or tenderness noted.  Anus and perineum are without rashes or lesions.   ***  Skin: No rashes, bruises or suspicious lesions. Lymph: No cervical or inguinal adenopathy. Neurologic: Grossly intact, no focal deficits, moving all 4 extremities. Psychiatric: Normal mood and affect.   Laboratory Data: Lab Results  Component Value Date   WBC 6.4 10/23/2015   HGB 12.1 10/23/2015   HCT 35.7 10/23/2015   MCV 86.2 10/23/2015   PLT 188 10/23/2015    Lab Results  Component Value Date   CREATININE 0.70 10/23/2015    No results found for: PSA  No results found for: TESTOSTERONE  No results found for: HGBA1C  No results found for: TSH  No results found for: CHOL, HDL, CHOLHDL, VLDL, LDLCALC  Lab Results  Component Value Date   AST 24 10/23/2015   Lab Results  Component Value Date   ALT 18 10/23/2015   No components found for: ALKALINEPHOPHATASE No components found for: BILIRUBINTOTAL  No results found for: ESTRADIOL  I have reviewed the labs.   Pertinent Imaging: ***  Assessment & Plan:    1. Left ureteral stone Explained to the patient that since their stone is  ?10 mm, it is in within AUA Guidelines to continue MET with tamsulosin and pushing fluids for 4 to 6 weeks - she will continue MET at this time and return next week for KUB and office visit Patient is advised that if they should start to experience pain that is not able to be controlled with pain medication, intractable nausea and/or vomiting and/or fevers greater than 103 or shaking chills to contact the office immediately or seek treatment in the emergency department for emergent intervention.    2. Left hydronephrosis - obtain RUS to ensure the hydronephrosis has resolved once they have passed and/or recovered from procedure to ensure to iatrogenic hydronephrosis remains - it is explained to the patient that it is important to  document resolution of the hydronephrosis as "silent hydronephrosis" can occur and cause damage and/or loss of the kidney  3. Gross hematuria - continue to monitor the patient's UA after the treatment/passage of the stone to ensure the hematuria has resolved - if hematuria persists, we will pursue a hematuria workup with CT Urogram and cystoscopy if appropriate.  4. Dermoid cyst  -has not had recent follow up for the cyst    No follow-ups on file.  These notes generated with voice recognition software. I apologize for typographical errors.  Zara Council, PA-C  [facility] [address]  [place], [place] [address] [phone]

## 2018-03-02 ENCOUNTER — Other Ambulatory Visit: Payer: Self-pay | Admitting: Urology

## 2018-03-02 ENCOUNTER — Ambulatory Visit: Payer: Managed Care, Other (non HMO) | Admitting: Urology

## 2018-03-02 ENCOUNTER — Ambulatory Visit (INDEPENDENT_AMBULATORY_CARE_PROVIDER_SITE_OTHER): Payer: Managed Care, Other (non HMO) | Admitting: Urology

## 2018-03-02 ENCOUNTER — Ambulatory Visit
Admission: RE | Admit: 2018-03-02 | Discharge: 2018-03-02 | Disposition: A | Discharge status: Home / Self Care | Payer: Managed Care, Other (non HMO) | Source: Ambulatory Visit | Attending: Urology | Admitting: Urology

## 2018-03-02 ENCOUNTER — Other Ambulatory Visit
Admission: RE | Admit: 2018-03-02 | Discharge: 2018-03-02 | Disposition: A | Discharge status: Home / Self Care | Payer: Managed Care, Other (non HMO) | Source: Ambulatory Visit | Attending: Urology | Admitting: Urology

## 2018-03-02 ENCOUNTER — Ambulatory Visit
Admission: RE | Admit: 2018-03-02 | Discharge: 2018-03-02 | Disposition: A | Discharge status: Home / Self Care | Payer: Managed Care, Other (non HMO) | Attending: Urology | Admitting: Urology

## 2018-03-02 ENCOUNTER — Encounter: Payer: Self-pay | Admitting: Urology

## 2018-03-02 ENCOUNTER — Other Ambulatory Visit: Payer: Self-pay

## 2018-03-02 VITALS — BP 112/75 | HR 72 | Temp 97.2°F | Resp 14 | Ht 65.0 in | Wt 149.5 lb

## 2018-03-02 DIAGNOSIS — N201 Calculus of ureter: Secondary | ICD-10-CM | POA: Insufficient documentation

## 2018-03-02 DIAGNOSIS — R310 Gross hematuria: Secondary | ICD-10-CM | POA: Diagnosis not present

## 2018-03-02 DIAGNOSIS — N132 Hydronephrosis with renal and ureteral calculous obstruction: Secondary | ICD-10-CM

## 2018-03-02 LAB — URINALYSIS, COMPLETE (UACMP) WITH MICROSCOPIC
BILIRUBIN URINE: NEGATIVE
Bacteria, UA: NONE SEEN
Glucose, UA: NEGATIVE mg/dL
Ketones, ur: NEGATIVE mg/dL
Leukocytes,Ua: NEGATIVE
Nitrite: NEGATIVE
Protein, ur: NEGATIVE mg/dL
SQUAMOUS EPITHELIAL / LPF: NONE SEEN (ref 0–5)
Specific Gravity, Urine: 1.02 (ref 1.005–1.030)
pH: 7 (ref 5.0–8.0)

## 2018-03-02 NOTE — Progress Notes (Signed)
03/02/2018 1:10 PM   [employer] [birth year] [id number]  Referring provider: No referring provider defined for this encounter.  Chief Complaint  Patient presents with  . Follow-up    KUB    HPI: Patient is a 22 year old African American female who was seen by [facility] urgent [facility] for nephrolithiasis who presents today for a follow up KUB for a left ureteral stone.    Background history  On 01/22/2018, she presented to the urgent care for 6/10 bilateral flank pain that radiated to the lower abdomen and bladder.  CT Renal Stone study revealed LEFT ureteral 4 mm calculus with mild proximal ureteral dilatation.  Nonobstructing nephrolithiasis on the RIGHT.   Calcified lesion in the RIGHT adnexa may be related to the ovary. Further evaluation with a dedicated pelvic ultrasound may be considered if clinically indicated.  Labs in the ED:  Urine dip was positive for blood.    Meds given in the ED: hydrocodone/APAP 5/325 Prior urological history:  ES[facility] 2016  Current NSAID/anticoagulation:   Toradol IM at [facility] on 01/22/2018.     KUB 02/23/2018 noted two possible stones in the upper pole the right kidney.  No evidence of a ureteral stone. No acute findings.  Mild generalized increased colonic stool burden.  Dermoid cyst in the left lower quadrant.    KUB on 03/02/2018 revealed nonobstructive bowel gas pattern.  3 mm calcification over the medial aspect of the mid to lower pole of the right kidney possibly a renal pelvic stone.  1 cm calcification projecting just right of midline over the sacrum of uncertain clinical significance.   She states that she has not had any pain for the last week, but she has not captured a stone.  She states she usually feels a stone when they pass.  She also states that since January she has been having on and off again renal colic and she is fearful that the stone is still present.    Her UA today positive for 11-20 RBC's and 0-5 WBC's.         PMH: Past  Medical History:  Diagnosis Date  . Asthma   . Chlamydia   . Dermoid cyst   . Eczema   . Family history of breast cancer   . Flank pain   . Hematuria    gross  . History of kidney stones    H/O  . Immunization, viral disease 03/03/2012   gardasil series completed  . Kidney stones     Surgical History: Past Surgical History:  Procedure Laterality Date  . LITHOTRIPSY    . TONSILLECTOMY  2003  . TYMPANOSTOMY TUBE PLACEMENT      Home Medications:  Allergies as of 03/02/2018   No Known Allergies     Medication List       Accurate as of March 02, 2018 11:59 PM. Always use your most recent med list.        albuterol 108 (90 Base) MCG/ACT inhaler Commonly known as:  PROVENTIL HFA;VENTOLIN HFA Inhale 2 puffs into the lungs every 6 (six) hours as needed for wheezing or shortness of breath.   EPIDUO FORTE 0.3-2.5 % Gel Generic drug:  Adapalene-Benzoyl Peroxide 1 Dose daily.       Allergies: No Known Allergies  Family History: Family History  Problem Relation Age of Onset  . Kidney Stones Mother   . Hypertension Mother   . Kidney Stones Father   . Kidney Stones Sister   . Breast cancer  Maternal Grandmother 69  . Hypertension Maternal Grandmother   . Brain cancer Maternal Grandfather   . Cancer Maternal Grandfather        lung ca - smoker  . Brain cancer Paternal Aunt   . Breast cancer Other   . Breast cancer Other   . Breast cancer Other   . Hypertension Maternal Aunt   . Kidney disease Neg Hx     Social History:  reports that she has never smoked. She has never used smokeless tobacco. She reports that she does not drink alcohol or use drugs.  ROS: UROLOGY Frequent Urination?: Yes Hard to postpone urination?: No Burning/pain with urination?: No Get up at night to urinate?: Yes Leakage of urine?: No Urine stream starts and stops?: No Trouble starting stream?: No Do you have to strain to urinate?: No Blood in urine?: Yes Urinary tract infection?:  Yes Sexually transmitted disease?: No Injury to kidneys or bladder?: No Painful intercourse?: No Weak stream?: No Currently pregnant?: No Vaginal bleeding?: No Last menstrual period?: n  Gastrointestinal Nausea?: No Vomiting?: No Indigestion/heartburn?: No Diarrhea?: No Constipation?: No  Constitutional Fever: No Night sweats?: No Weight loss?: No Fatigue?: No  Skin Skin rash/lesions?: No Itching?: No  Eyes Blurred vision?: No Double vision?: No  Ears/Nose/Throat Sore throat?: No Sinus problems?: No  Hematologic/Lymphatic Swollen glands?: No Easy bruising?: No  Cardiovascular Leg swelling?: No Chest pain?: No  Respiratory Cough?: No Shortness of breath?: No  Endocrine Excessive thirst?: No  Musculoskeletal Back pain?: Yes Joint pain?: No  Neurological Headaches?: No Dizziness?: No  Psychologic Depression?: No Anxiety?: No  Physical Exam: BP 112/75   Pulse 72   Temp (!) 97.2 F (36.2 C) (Oral)   Resp 14   Ht 5\' 5"  (1.651 m)   Wt 149 lb 8 oz (67.8 kg)   LMP 02/22/2018 Comment: preg signed waiver, denies preg  BMI 24.88 kg/m   Constitutional:  Well nourished. Alert and oriented, No acute distress. HEENT: [place] AT, moist mucus membranes.  Trachea midline, no masses. Cardiovascular: No clubbing, cyanosis, or edema. Respiratory: Normal respiratory effort, no increased work of breathing. Skin: No rashes, bruises or suspicious lesions. Neurologic: Grossly intact, no focal deficits, moving all 4 extremities. Psychiatric: Normal mood and affect.   Laboratory Data: Lab Results  Component Value Date   WBC 6.4 10/23/2015   HGB 12.1 10/23/2015   HCT 35.7 10/23/2015   MCV 86.2 10/23/2015   PLT 188 10/23/2015    Lab Results  Component Value Date   CREATININE 0.70 10/23/2015    No results found for: PSA  No results found for: TESTOSTERONE  No results found for: HGBA1C  No results found for: TSH  No results found for: CHOL, HDL,  CHOLHDL, VLDL, LDLCALC  Lab Results  Component Value Date   AST 24 10/23/2015   Lab Results  Component Value Date   ALT 18 10/23/2015   No components found for: ALKALINEPHOPHATASE No components found for: BILIRUBINTOTAL  No results found for: ESTRADIOL  Urinalysis  11-20 RBC's. 0-5 WBC's.   See Epic.  I have reviewed the labs.   Pertinent Imaging: CLINICAL DATA:  Left ureteral stone since January.  EXAM: ABDOMEN - 1 VIEW  COMPARISON:  02/23/2018 and 07/05/2015  FINDINGS: Bowel gas pattern is nonobstructive with mild fecal retention throughout the colon. No definite calcifications over the left kidney. 3 mm calcific density projects over the medial aspect of the mid to lower pole of the right kidney at the L1-2 level  which may represent a right renal pelvis stone. There is an oval 1 cm calcific density just right of midline over the sacrum likely 2 medial to be over the course of the ureter. No other ureteral stones are seen. Stable dense calcification over the left pelvis. Remainder the exam is unchanged.  IMPRESSION: Nonobstructive bowel gas pattern.  3 mm calcification over the medial aspect of the mid to lower pole of the right kidney possibly a renal pelvic stone.  1 cm calcification projecting just right of midline over the sacrum of uncertain clinical significance.   Electronically Signed   By: Marin Olp M.D.   On: 03/02/2018 20:21  I have independently reviewed the films and appreciate the above mentioned findings.  Waiting on digitization of CT disc for comparison.    Assessment & Plan:    1. Left ureteral stone She has been pain free for the last week, but she is fearful the pain will return as the pain has been intermittent since January  She has brought in her disc and we will courier it over to [facility] for it to be digitized  UA positive for 11-20 RBC's and 0-5 WBC's.   We will obtain a RUS to see if any evidence of the ureteral stone  still present - will call with results  Patient is advised that if they should start to experience pain that is not able to be controlled with pain medication, intractable nausea and/or vomiting and/or fevers greater than 103 or shaking chills to contact the office immediately or seek treatment in the emergency department for emergent intervention.    2. Left hydronephrosis See above   3. Gross hematuria UA today 11-20 RBC's.  - will send for culture  - continue to monitor the patient's UA after the treatment/passage of the stone to ensure the hematuria has resolved - if hematuria persists, we will pursue a hematuria workup with CT Urogram and cystoscopy if appropriate.  4. Dermoid cyst  -has not had recent follow up for the cyst    Return for pending urine culture and RUS results .  These notes generated with voice recognition software. I apologize for typographical errors.  Zara Council, PA-C  [facility] [address]  [place], [place] [address] [phone]

## 2018-03-04 LAB — URINE CULTURE

## 2018-03-04 NOTE — Telephone Encounter (Signed)
-----   Message from Nori Riis, PA-C sent at 03/04/2018  9:16 AM EST ----- Please ask [place] to provide another urine for culture as her specimen from Monday is contaminated.

## 2018-03-04 NOTE — Telephone Encounter (Signed)
Called patient per [facility] for a repeat urine culture. Made appt for lab tomorrow. Patient confirmed tomorrow for specimen drop off

## 2018-03-05 ENCOUNTER — Other Ambulatory Visit: Payer: Managed Care, Other (non HMO)

## 2018-03-05 DIAGNOSIS — R319 Hematuria, unspecified: Secondary | ICD-10-CM

## 2018-03-09 ENCOUNTER — Telehealth: Payer: Self-pay

## 2018-03-09 LAB — CULTURE, URINE COMPREHENSIVE

## 2018-03-09 NOTE — Telephone Encounter (Signed)
Called pt informed her of the information below. Pt gave verbal understanding.  

## 2018-03-09 NOTE — Telephone Encounter (Signed)
-----   Message from Nori Riis, PA-C sent at 03/09/2018  7:58 AM EST ----- Please let [place] know that her urine culture grew out a normal vaginal bacteria.  No infection.

## 2018-03-16 ENCOUNTER — Telehealth: Payer: Self-pay | Admitting: Urology

## 2018-03-16 ENCOUNTER — Other Ambulatory Visit: Payer: Self-pay

## 2018-03-16 ENCOUNTER — Ambulatory Visit
Admission: RE | Admit: 2018-03-16 | Discharge: 2018-03-16 | Disposition: A | Discharge status: Home / Self Care | Payer: Managed Care, Other (non HMO) | Source: Ambulatory Visit | Attending: Urology | Admitting: Urology

## 2018-03-16 DIAGNOSIS — R310 Gross hematuria: Secondary | ICD-10-CM | POA: Diagnosis present

## 2018-03-16 DIAGNOSIS — N132 Hydronephrosis with renal and ureteral calculous obstruction: Secondary | ICD-10-CM | POA: Insufficient documentation

## 2018-03-16 DIAGNOSIS — N201 Calculus of ureter: Secondary | ICD-10-CM | POA: Insufficient documentation

## 2018-03-16 NOTE — Telephone Encounter (Signed)
Would someone track down Kristen Duarte's CD of her CT scan?  It was to go to radiology for digitization, but I do not see it uploaded on the system.

## 2018-03-17 ENCOUNTER — Other Ambulatory Visit: Payer: Self-pay | Admitting: Urology

## 2018-03-17 ENCOUNTER — Telehealth: Payer: Self-pay

## 2018-03-17 ENCOUNTER — Ambulatory Visit
Admission: RE | Admit: 2018-03-17 | Discharge: 2018-03-17 | Disposition: A | Discharge status: Home / Self Care | Payer: Self-pay | Source: Ambulatory Visit | Attending: Urology | Admitting: Urology

## 2018-03-17 DIAGNOSIS — R310 Gross hematuria: Secondary | ICD-10-CM

## 2018-03-17 DIAGNOSIS — N201 Calculus of ureter: Secondary | ICD-10-CM

## 2018-03-17 DIAGNOSIS — N132 Hydronephrosis with renal and ureteral calculous obstruction: Secondary | ICD-10-CM

## 2018-03-17 NOTE — Telephone Encounter (Signed)
-----   Message from Nori Riis, PA-C sent at 03/17/2018  7:44 AM EDT ----- Please let Kristen Duarte know that her ultrasound was negative for any hydronephrosis which means she has likely passed her stones.  I still need to look at her CT scan, but they haven't digitized yet.

## 2018-03-17 NOTE — Telephone Encounter (Signed)
Left message for patient to return call for results.

## 2018-03-17 NOTE — Telephone Encounter (Signed)
Patient's CDs of imaging have been uploaded into the patient's chart.

## 2018-03-18 NOTE — Telephone Encounter (Signed)
Pt returned call and I read message from Shannon. 

## 2018-04-21 ENCOUNTER — Encounter: Payer: Self-pay | Admitting: Advanced Practice Midwife

## 2018-04-21 ENCOUNTER — Other Ambulatory Visit (HOSPITAL_COMMUNITY)
Admission: RE | Admit: 2018-04-21 | Discharge: 2018-04-21 | Disposition: A | Discharge status: Home / Self Care | Payer: Managed Care, Other (non HMO) | Source: Ambulatory Visit | Attending: Advanced Practice Midwife | Admitting: Advanced Practice Midwife

## 2018-04-21 ENCOUNTER — Other Ambulatory Visit: Payer: Self-pay

## 2018-04-21 ENCOUNTER — Ambulatory Visit (INDEPENDENT_AMBULATORY_CARE_PROVIDER_SITE_OTHER): Payer: Managed Care, Other (non HMO) | Admitting: Advanced Practice Midwife

## 2018-04-21 VITALS — BP 128/88 | HR 119 | Ht 65.0 in | Wt 151.0 lb

## 2018-04-21 DIAGNOSIS — R102 Pelvic and perineal pain: Secondary | ICD-10-CM | POA: Diagnosis present

## 2018-04-21 DIAGNOSIS — R300 Dysuria: Secondary | ICD-10-CM | POA: Insufficient documentation

## 2018-04-21 DIAGNOSIS — R350 Frequency of micturition: Secondary | ICD-10-CM | POA: Insufficient documentation

## 2018-04-21 LAB — POCT URINALYSIS DIPSTICK
Bilirubin, UA: NEGATIVE
Glucose, UA: NEGATIVE
Ketones, UA: NEGATIVE
Leukocytes, UA: NEGATIVE
Nitrite, UA: NEGATIVE
Protein, UA: POSITIVE — AB
Spec Grav, UA: 1.015 (ref 1.010–1.025)
Urobilinogen, UA: NEGATIVE E.U./dL — AB
pH, UA: 6.5 (ref 5.0–8.0)

## 2018-04-21 NOTE — Progress Notes (Signed)
Patient ID: Ronn Melena, female   DOB: August 11, 1995, 23 y.o.   MRN: [id number]  Reason for Visit: Abdominal Pain and Vaginal Pain (sm. discharge)    Subjective:     HPI:  [place] Graig is a 23 y.o. G51 P0000 female with pelvic and abdominal pain. She was seen and diagnosed with kidney stones in January. She had follow up imaging in 03/10/2022 and was told she had passed the stones. She continued to have pelvic and flank pain and CVA tenderness following that visit. She denies receiving dietary instructions regarding kidney stones except to reduce salt intake. She also has a history of Left dermoid cyst. She has regular pain at the site of her dermoid cyst. 2 days ago she began to have urethral pain with urinary frequency and urgency. The urethral pain is not necessarily associated with urination. She was told to follow up with Gyn regarding the cyst. She denies any current pain today but due to the fact that she has regular pain, she is here requesting lab work for urinary and vaginal infections. She denies fever, chills, shortness of breath, nausea, vomiting, constipation, diarrhea. She admits drinking more soda than water.    Past Medical History:  Diagnosis Date  . Asthma   . Chlamydia   . Dermoid cyst   . Eczema   . Family history of breast cancer   . Flank pain   . Hematuria    gross  . History of kidney stones    H/O  . Immunization, viral disease March 10, 2012   gardasil series completed  . Kidney stones    Family History  Problem Relation Age of Onset  . Kidney Stones Mother   . Hypertension Mother   . Kidney Stones Father   . Kidney Stones Sister   . Breast cancer Maternal Grandmother 36  . Hypertension Maternal Grandmother   . Brain cancer Maternal Grandfather   . Cancer Maternal Grandfather        lung ca - smoker  . Brain cancer Paternal Aunt   . Breast cancer Other   . Breast cancer Other   . Breast cancer Other   . Hypertension Maternal Aunt   . Kidney disease Neg  Hx    Past Surgical History:  Procedure Laterality Date  . LITHOTRIPSY    . TONSILLECTOMY  2003  . TYMPANOSTOMY TUBE PLACEMENT      Short Social History:  Social History   Tobacco Use  . Smoking status: Never Smoker  . Smokeless tobacco: Never Used  Substance Use Topics  . Alcohol use: No    Alcohol/week: 0.0 standard drinks    No Known Allergies  Current Outpatient Medications  Medication Sig Dispense Refill  . albuterol (PROVENTIL HFA;VENTOLIN HFA) 108 (90 Base) MCG/ACT inhaler Inhale 2 puffs into the lungs every 6 (six) hours as needed for wheezing or shortness of breath.    . EPIDUO FORTE 0.3-2.5 % GEL 1 Dose daily.      No current facility-administered medications for this visit.     Review of Systems  Constitutional: Negative.   HENT: Negative.   Eyes: Negative.   Respiratory: Negative.   Cardiovascular: Negative.   Gastrointestinal: Positive for abdominal pain.  Genitourinary: Positive for dysuria, flank pain, frequency and urgency.  Musculoskeletal: Positive for back pain.  Skin: Negative.   Neurological: Negative.   Endo/Heme/Allergies: Negative.   Psychiatric/Behavioral: Negative.         Objective:  Objective   Vitals:   04/21/18 1108  BP: 128/88  Pulse: (!) 119  Weight: 151 lb (68.5 kg)  Height: 5\' 5"  (1.651 m)   Body mass index is 25.13 kg/m. Constitutional: Well nourished, well developed female in no acute distress.  HEENT: normal Skin: Warm and dry.  Cardiovascular: Regular rate and rhythm.   Respiratory:  Normal respiratory effort Abdomen: soft, nontender, nondistended, no abnormal masses, no epigastric pain Back: mild CVAT Neuro: DTRs 2+, Cranial nerves grossly intact Psych: Alert and Oriented x3. No memory deficits. Normal mood and affect.  MS: normal gait, normal bilateral lower extremity ROM/strength/stability.  Pelvic exam:  is not limited by body habitus EGBUS: within normal limits Vagina: within normal limits and with  normal mucosa  Cervix: not evaluated  Data: Results for ALIVIYA, SCHOELLER (MRN [id number]) as of 04/21/2018 12:03   Ref. Range 04/21/2018 11:22  Bilirubin, UA Unknown Negative  Clarity, UA Unknown Clear  Color, UA Unknown Tea color  Glucose Latest Ref Range: Negative  Negative  Ketones, UA Unknown Negative  Leukocytes,UA Latest Ref Range: Negative  Negative  Nitrite, UA Unknown Negative  pH, UA Latest Ref Range: 5.0 - 8.0  6.5  Protein,UA Latest Ref Range: Negative  Positive (A)  Specific Gravity, UA Latest Ref Range: 1.010 - 1.025  1.015  Urobilinogen, UA Latest Ref Range: 0.2 or 1.0 E.U./dL negative (A)  RBC, UA Unknown Large       Assessment/Plan:     23 yo G0 P0000 female with dysuria and pelvic pain  Aptima collected/sent Urine Culture sent F/U with lab results Dietary and general kidney stone instructions given Increase hydration til urine is clear to light yellow Decrease soda intake RTC in 1 week for TVUS to evaluate dermoid cyst   Christean Leaf, [facility] 04/21/2018, 12:08 PM

## 2018-04-21 NOTE — Patient Instructions (Signed)
Dietary Guidelines to Help Prevent Kidney Stones Kidney stones are deposits of minerals and salts that form inside your kidneys. Your risk of developing kidney stones may be greater depending on your diet, your lifestyle, the medicines you take, and whether you have certain medical conditions. Most people can reduce their chances of developing kidney stones by following the instructions below. Depending on your overall health and the type of kidney stones you tend to develop, your dietitian may give you more specific instructions. What are tips for following this plan? Reading food labels  Choose foods with "no salt added" or "low-salt" labels. Limit your sodium intake to less than 1500 mg per day.  Choose foods with calcium for each meal and snack. Try to eat about 300 mg of calcium at each meal. Foods that contain 200-500 mg of calcium per serving include: ? 8 oz (237 ml) of milk, fortified nondairy milk, and fortified fruit juice. ? 8 oz (237 ml) of kefir, yogurt, and soy yogurt. ? 4 oz (118 ml) of tofu. ? 1 oz of cheese. ? 1 cup (300 g) of dried figs. ? 1 cup (91 g) of cooked broccoli. ? 1-3 oz can of sardines or mackerel.  Most people need 1000 to 1500 mg of calcium each day. Talk to your dietitian about how much calcium is recommended for you. Shopping  Buy plenty of fresh fruits and vegetables. Most people do not need to avoid fruits and vegetables, even if they contain nutrients that may contribute to kidney stones.  When shopping for convenience foods, choose: ? Whole pieces of fruit. ? Premade salads with dressing on the side. ? Low-fat fruit and yogurt smoothies.  Avoid buying frozen meals or prepared deli foods.  Look for foods with live cultures, such as yogurt and kefir. Cooking  Do not add salt to food when cooking. Place a salt shaker on the table and allow each person to add his or her own salt to taste.  Use vegetable protein, such as beans, textured vegetable  protein (TVP), or tofu instead of meat in pasta, casseroles, and soups. Meal planning   Eat less salt, if told by your dietitian. To do this: ? Avoid eating processed or premade food. ? Avoid eating fast food.  Eat less animal protein, including cheese, meat, poultry, or fish, if told by your dietitian. To do this: ? Limit the number of times you have meat, poultry, fish, or cheese each week. Eat a diet free of meat at least 2 days a week. ? Eat only one serving each day of meat, poultry, fish, or seafood. ? When you prepare animal protein, cut pieces into small portion sizes. For most meat and fish, one serving is about the size of one deck of cards.  Eat at least 5 servings of fresh fruits and vegetables each day. To do this: ? Keep fruits and vegetables on hand for snacks. ? Eat 1 piece of fruit or a handful of berries with breakfast. ? Have a salad and fruit at lunch. ? Have two kinds of vegetables at dinner.  Limit foods that are high in a substance called oxalate. These include: ? Spinach. ? Rhubarb. ? Beets. ? Potato chips and french fries. ? Nuts.  If you regularly take a diuretic medicine, make sure to eat at least 1-2 fruits or vegetables high in potassium each day. These include: ? Avocado. ? Banana. ? Orange, prune, carrot, or tomato juice. ? Baked potato. ? Cabbage. ? Beans and split   peas. General instructions   Drink enough fluid to keep your urine clear or pale yellow. This is the most important thing you can do.  Talk to your health care provider and dietitian about taking daily supplements. Depending on your health and the cause of your kidney stones, you may be advised: ? Not to take supplements with vitamin C. ? To take a calcium supplement. ? To take a daily probiotic supplement. ? To take other supplements such as magnesium, fish oil, or vitamin B6.  Take all medicines and supplements as told by your health care provider.  Limit alcohol intake to no  more than 1 drink a day for nonpregnant women and 2 drinks a day for men. One drink equals 12 oz of beer, 5 oz of wine, or 1 oz of hard liquor.  Lose weight if told by your health care provider. Work with your dietitian to find strategies and an eating plan that works best for you. What foods are not recommended? Limit your intake of the following foods, or as told by your dietitian. Talk to your dietitian about specific foods you should avoid based on the type of kidney stones and your overall health. Grains Breads. Bagels. Rolls. Baked goods. Salted crackers. Cereal. Pasta. Vegetables Spinach. Rhubarb. Beets. Canned vegetables. Angie Fava. Olives. Meats and other protein foods Nuts. Nut butters. Large portions of meat, poultry, or fish. Salted or cured meats. Deli meats. Hot dogs. Sausages. Dairy Cheese. Beverages Regular soft drinks. Regular vegetable juice. Seasonings and other foods Seasoning blends with salt. Salad dressings. Canned soups. Soy sauce. Ketchup. Barbecue sauce. Canned pasta sauce. Casseroles. Pizza. Lasagna. Frozen meals. Potato chips. [place] fries. Summary  You can reduce your risk of kidney stones by making changes to your diet.  The most important thing you can do is drink enough fluid. You should drink enough fluid to keep your urine clear or pale yellow.  Ask your health care provider or dietitian how much protein from animal sources you should eat each day, and also how much salt and calcium you should have each day. This information is not intended to replace advice given to you by your health care provider. Make sure you discuss any questions you have with your health care provider. Document Released: 04/20/2010 Document Revised: 12/05/2015 Document Reviewed: 12/05/2015 Elsevier Interactive Patient Education  2019 [employer]. Kidney Stones  Kidney stones (urolithiasis) are solid, rock-like deposits that form inside of the organs that make urine (kidneys). A  kidney stone may form in a kidney and move into the bladder, where it can cause intense pain and block the flow of urine. Kidney stones are created when high levels of certain minerals are found in the urine. They are usually passed through urination, but in some cases, medical treatment may be needed to remove them. What are the causes? Kidney stones may be caused by:  A condition in which certain glands produce too much parathyroid hormone (primary hyperparathyroidism), which causes too much calcium buildup in the blood.  Buildup of uric acid crystals in the bladder (hyperuricosuria). Uric acid is a chemical that the body produces when you eat certain foods. It usually exits the body in the urine.  Narrowing (stricture) of one or both of the tubes that drain urine from the kidneys to the bladder (ureters).  A kidney blockage that is present at birth (congenital obstruction).  Past surgery on the kidney or the ureters, such as gastric bypass surgery. What increases the risk? The following factors make  you more likely to develop kidney stones:  Having had a kidney stone in the past.  Having a family history of kidney stones.  Not drinking enough water.  Eating a diet that is high in protein, salt (sodium), or sugar.  Being overweight or obese. What are the signs or symptoms? Symptoms of a kidney stone may include:  Nausea.  Vomiting.  Blood in the urine (hematuria).  Pain in the side of the abdomen, right below the ribs (flank pain). Pain usually spreads (radiates) to the groin.  Needing to urinate frequently or urgently. How is this diagnosed? This condition may be diagnosed based on:  Your medical history.  A physical exam.  Blood tests.  Urine tests.  CT scan.  Abdominal X-ray.  A procedure to examine the inside of the bladder (cystoscopy). How is this treated? Treatment for kidney stones depends on the size, location, and makeup of the stones. Treatment may  involve:  Analyzing your urine before and after you pass the stone through urination.  Being monitored at the hospital until you pass the stone through urination.  Increasing your fluid intake and decreasing the amount of calcium and protein in your diet.  A procedure to break up kidney stones in the bladder using: ? A focused beam of light (laser therapy). ? Shock waves (extracorporeal shock wave lithotripsy).  Surgery to remove kidney stones. This may be needed if you have severe pain or have stones that block your urinary tract. Follow these instructions at home: Eating and drinking  Drink enough fluid to keep your urine clear or pale yellow. This will help you to pass the kidney stone.  If directed, change your diet. This may include: ? Limiting how much sodium you eat. ? Eating more fruits and vegetables. ? Limiting how much meat, poultry, fish, and eggs you eat.  Follow instructions from your health care provider about eating or drinking restrictions. General instructions  Collect urine samples as told by your health care provider. You may need to collect a urine sample: ? 24 hours after you pass the stone. ? 8-12 weeks after passing the kidney stone, and every 6-12 months after that.  Strain your urine every time you urinate, for as long as directed. Use the strainer that your health care provider recommends.  Do not throw out the kidney stone after passing it. Keep the stone so it can be tested by your health care provider. Testing the makeup of your kidney stone may help prevent you from getting kidney stones in the future.  Take over-the-counter and prescription medicines only as told by your health care provider.  Keep all follow-up visits as told by your health care provider. This is important. You may need follow-up X-rays or ultrasounds to make sure that your stone has passed. How is this prevented? To prevent another kidney stone:  Drink enough fluid to keep  your urine clear or pale yellow. This is the best way to prevent kidney stones.  Eat a healthy diet and follow recommendations from your health care provider about foods to avoid. You may be instructed to eat a low-protein diet. Recommendations vary depending on the type of kidney stone that you have.  Maintain a healthy weight. Contact a health care provider if:  You have pain that gets worse or does not get better with medicine. Get help right away if:  You have a fever or chills.  You develop severe pain.  You develop new abdominal pain.  You faint.  You are unable to urinate. This information is not intended to replace advice given to you by your health care provider. Make sure you discuss any questions you have with your health care provider. Document Released: 12/24/2004 Document Revised: 06/06/2016 Document Reviewed: 06/09/2015 Elsevier Interactive Patient Education  2019 [employer].

## 2018-04-23 LAB — URINE CULTURE: Organism ID, Bacteria: NO GROWTH

## 2018-04-23 LAB — CERVICOVAGINAL ANCILLARY ONLY
Bacterial vaginitis: NEGATIVE
Candida vaginitis: NEGATIVE
Chlamydia: NEGATIVE
Neisseria Gonorrhea: NEGATIVE
Trichomonas: NEGATIVE

## 2018-05-04 ENCOUNTER — Ambulatory Visit: Payer: Managed Care, Other (non HMO) | Admitting: Advanced Practice Midwife

## 2018-05-04 ENCOUNTER — Other Ambulatory Visit: Payer: Managed Care, Other (non HMO)

## 2019-03-16 IMAGING — CR DG ABDOMEN 1V
2 series · 2 of 2 positions shown · non-contrast
Comparison: 02/23/2018 and 07/05/2015

CLINICAL DATA: Left ureteral stone since [REDACTED].

EXAM:
ABDOMEN - 1 VIEW

[abdomen kub (1 of 2)]
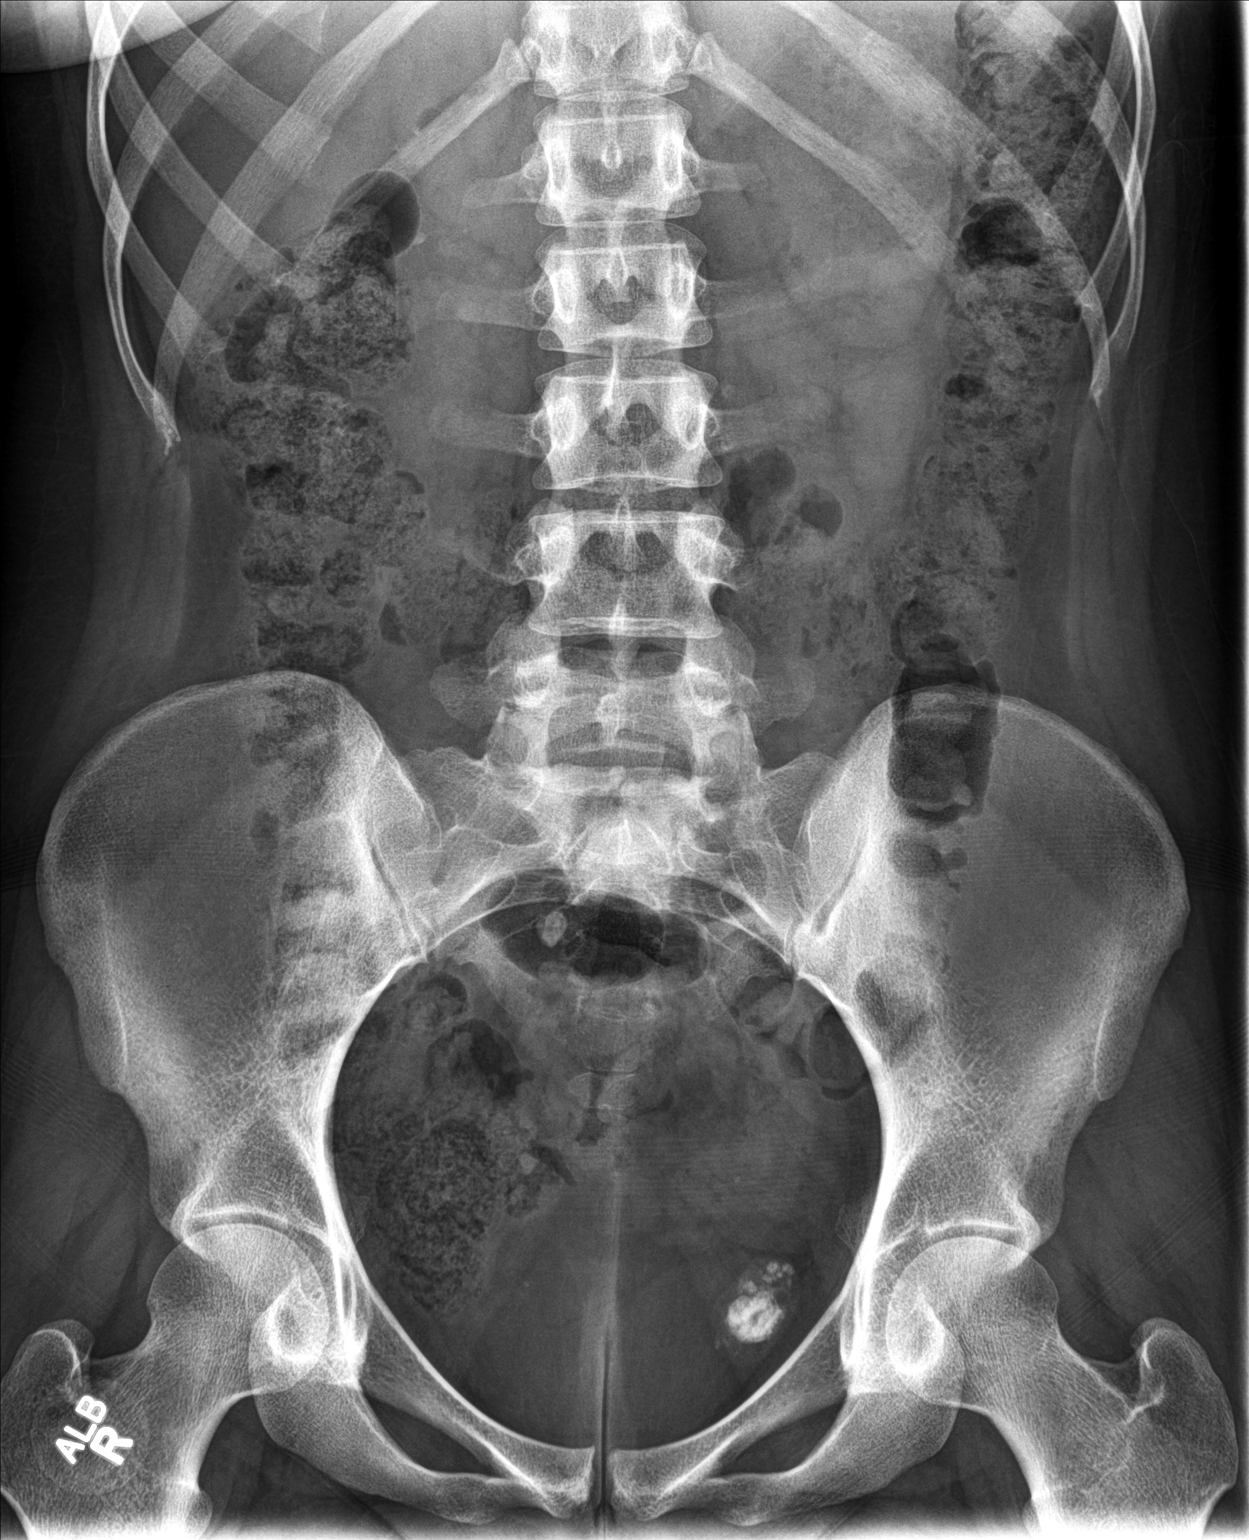

[abdomen kub (2 of 2)]
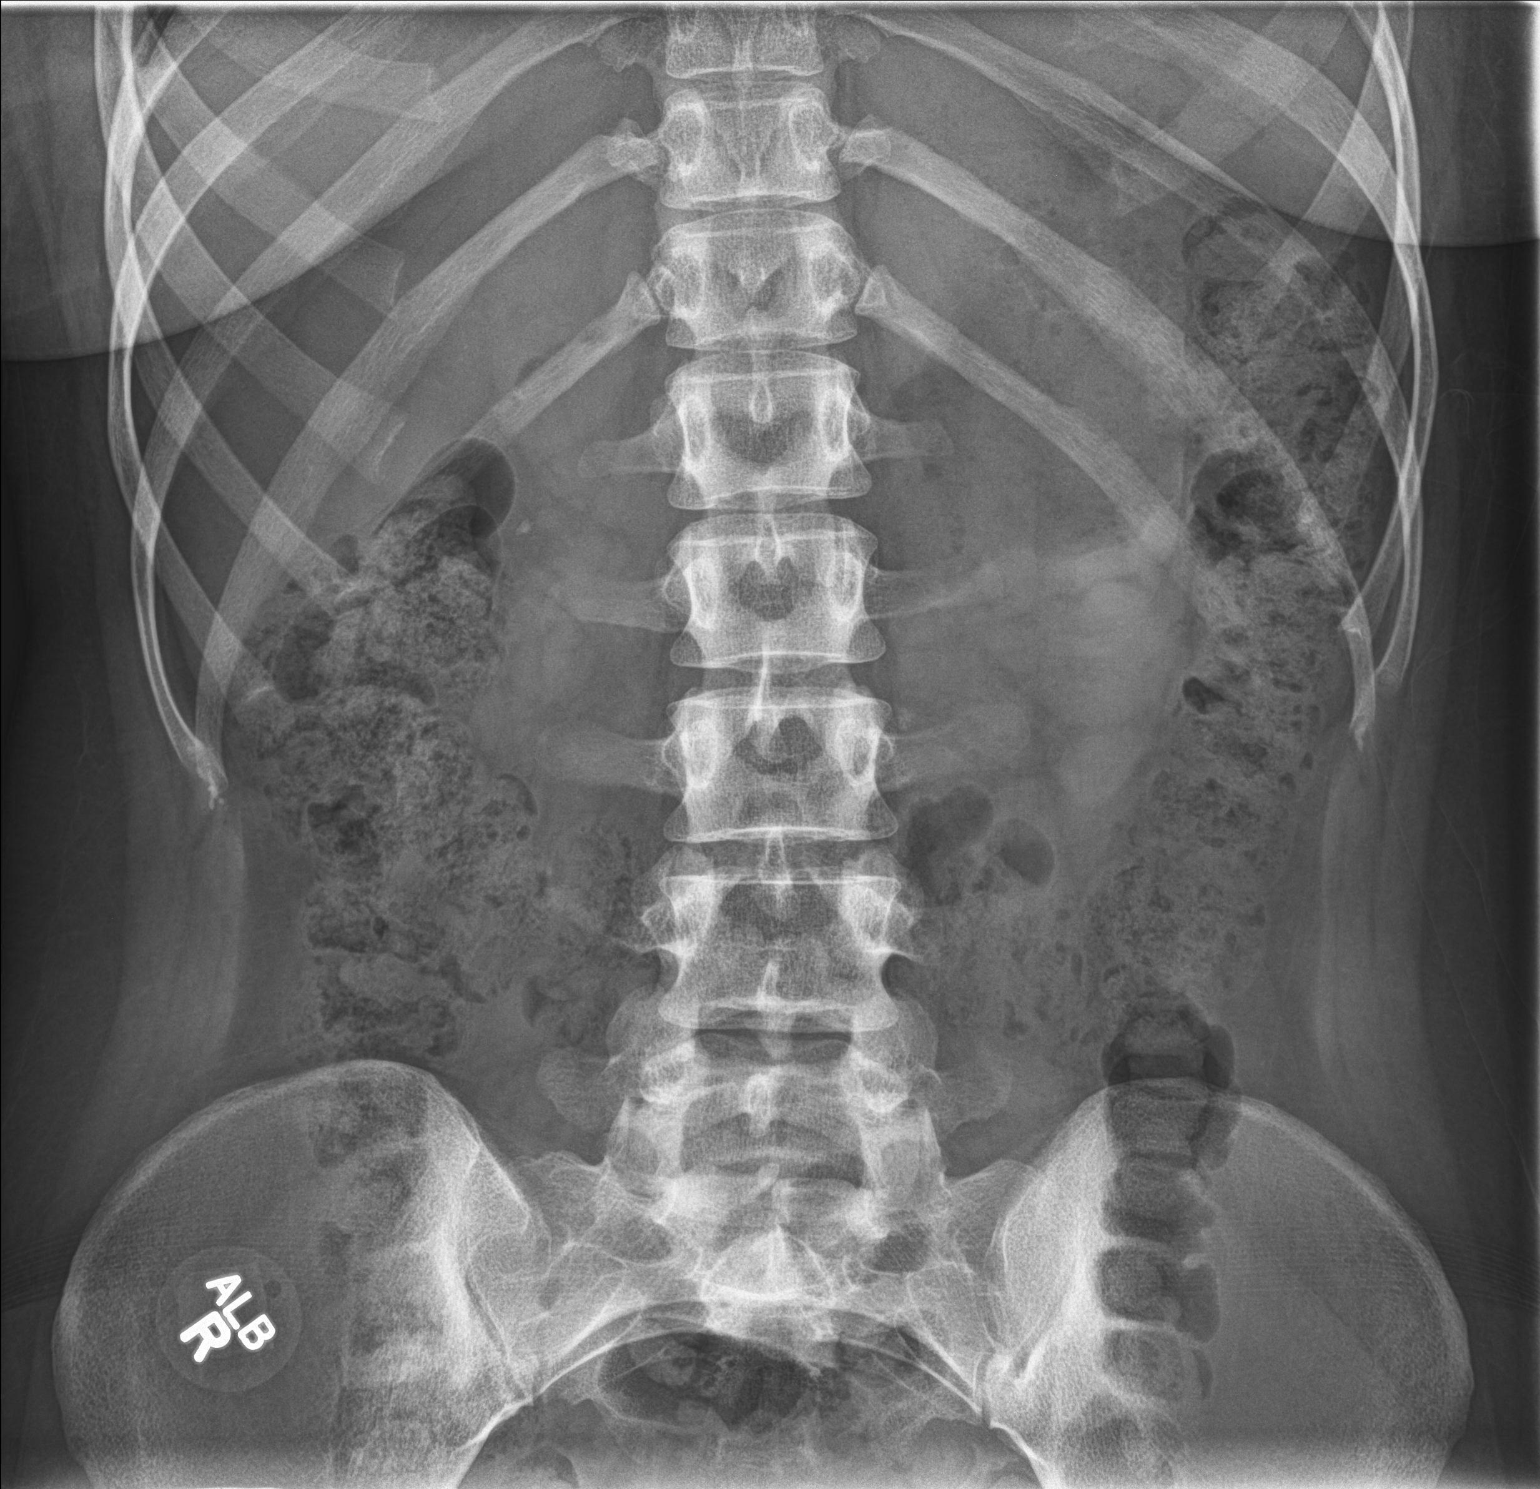

[2 of 2 positions shown; findings below may reference images not displayed]

FINDINGS: Bowel gas pattern is nonobstructive with mild fecal retention
throughout the colon. No definite calcifications over the left
kidney. 3 mm calcific density projects over the medial aspect of the
mid to lower pole of the right kidney at the L1-2 level which may
represent a right renal pelvis stone. There is an oval 1 cm calcific
density just right of midline over the sacrum likely 2 medial to be
over the course of the ureter. No other ureteral stones are seen.
Stable dense calcification over the left pelvis. Remainder the exam
is unchanged.
IMPRESSION: Nonobstructive bowel gas pattern.

3 mm calcification over the medial aspect of the mid to lower pole
of the right kidney possibly a renal pelvic stone.

1 cm calcification projecting just right of midline over the sacrum
of uncertain clinical significance.

## 2019-06-11 ENCOUNTER — Telehealth: Payer: Self-pay | Admitting: Urology

## 2019-06-11 NOTE — Telephone Encounter (Signed)
Would you schedule Kristen Duarte for a follow up appointment for her stone?  She will need a KUB prior?

## 2019-06-18 NOTE — Telephone Encounter (Signed)
LMOM to return call to schedule follow up with KUB prior.

## 2020-04-13 ENCOUNTER — Ambulatory Visit: Payer: Managed Care, Other (non HMO) | Admitting: Obstetrics and Gynecology

## 2020-07-12 ENCOUNTER — Ambulatory Visit (INDEPENDENT_AMBULATORY_CARE_PROVIDER_SITE_OTHER): Payer: Managed Care, Other (non HMO) | Admitting: Advanced Practice Midwife

## 2020-07-12 ENCOUNTER — Other Ambulatory Visit: Payer: Self-pay

## 2020-07-12 ENCOUNTER — Encounter: Payer: Self-pay | Admitting: Advanced Practice Midwife

## 2020-07-12 ENCOUNTER — Other Ambulatory Visit (HOSPITAL_COMMUNITY)
Admission: RE | Admit: 2020-07-12 | Discharge: 2020-07-12 | Disposition: A | Discharge status: Home / Self Care | Payer: Managed Care, Other (non HMO) | Source: Ambulatory Visit | Attending: Obstetrics and Gynecology | Admitting: Obstetrics and Gynecology

## 2020-07-12 VITALS — BP 127/81 | Ht 66.0 in | Wt 157.0 lb

## 2020-07-12 DIAGNOSIS — Z113 Encounter for screening for infections with a predominantly sexual mode of transmission: Secondary | ICD-10-CM | POA: Insufficient documentation

## 2020-07-12 DIAGNOSIS — Z124 Encounter for screening for malignant neoplasm of cervix: Secondary | ICD-10-CM

## 2020-07-12 DIAGNOSIS — Z0000 Encounter for general adult medical examination without abnormal findings: Secondary | ICD-10-CM

## 2020-07-12 DIAGNOSIS — Z1159 Encounter for screening for other viral diseases: Secondary | ICD-10-CM

## 2020-07-14 NOTE — Progress Notes (Signed)
Gynecology Annual Exam  PCP: Patient, No Pcp Per (Inactive)  Chief Complaint:  Chief Complaint  Patient presents with   Annual Exam    History of Present Illness: Patient is a 25 y.o. G0P0000 presents for annual exam. The patient has no gyn complaints today. She admits her cycle has gotten shorter and is around 21 days. She declines hormonal control. She is in same sex relationship and therefore declines contraception. We discussed doing PAP smear today as she has not had one.  LMP: Patient's last menstrual period was 07/02/2020. Menarche:14 Average Interval: regular,  21  days Duration of flow: 5 days Heavy Menses: no Clots: no Intermenstrual Bleeding: no Postcoital Bleeding: no Dysmenorrhea: no  The patient is sexually active. She currently uses none for contraception. She denies dyspareunia.  The patient does perform self breast exams.  There is no notable family history of breast or ovarian cancer in her family.  The patient wears seatbelts: yes.  The patient has regular exercise: she admits decreased level of exercise in the past 2 years. She admits healthy lifestyle; diet, hydration. She usually has 6-7 hours of sleep.    The patient denies current symptoms of depression.    Review of Systems: Review of Systems  Constitutional:  Negative for chills and fever.  HENT:  Negative for congestion, ear discharge, ear pain, hearing loss, sinus pain and sore throat.   Eyes:  Negative for blurred vision and double vision.  Respiratory:  Negative for cough, shortness of breath and wheezing.   Cardiovascular:  Negative for chest pain, palpitations and leg swelling.  Gastrointestinal:  Negative for abdominal pain, blood in stool, constipation, diarrhea, heartburn, melena, nausea and vomiting.  Genitourinary:  Negative for dysuria, flank pain, frequency, hematuria and urgency.  Musculoskeletal:  Negative for back pain, joint pain and myalgias.  Skin:  Negative for itching and  rash.  Neurological:  Negative for dizziness, tingling, tremors, sensory change, speech change, focal weakness, seizures, loss of consciousness, weakness and headaches.  Endo/Heme/Allergies:  Negative for environmental allergies. Does not bruise/bleed easily.  Psychiatric/Behavioral:  Negative for depression, hallucinations, memory loss, substance abuse and suicidal ideas. The patient is not nervous/anxious and does not have insomnia.    Past Medical History:  Patient Active Problem List   Diagnosis Date Noted   Left ureteral stone 01/22/2018    Last Assessment & Plan:  Symptomatic stone size and location: left 16mm proximal ureter Associated hydronephrosis: mild Other asymptomatic stone sizes and locations  Left kidney: none  Right kidney: none  Treatment options for the symptomatic ureteral stone were reviewed. The risks and benefits of each management strategy were discussed.  1) Observation: Medical expulsive  therapy will be initiated with an alpha blocking medication.  Continued pain and nausea control with medication.  This carries the risk of associated pain, nausea, vomiting, renal impairment, and infection.   2) ESWL: Slightly lower success rate than ureteroscopy, but has the benefit of being minimally invasive and associated with little immediate post procedural pain.  No stent required.  Carries the risk of the stone not fragmenting or fragmenting into pieces that become stuck and will not pass requiring a secondary procedure to retrieve the stone or fragments. Slight risk of hypertension for intrarenal stones in patients who have multiple shock wave lithotripsies.  There is risk of infection. There is the risk of a perinephric or subcapsular hematoma.   3) Ureteroscopy (URS):  More invasive and requires having a ureteral stent for about  5-10 days postoperatively, but has a very high success rate.   Risk of ureteral perforation.  Risk of infection. Stent has to be removed with an  in-office cystoscopy.  The stent is uncomfortable.  The patient would like to proceed with:  Observation and medical expulsive therapy: Increase fluids, continue alpha blocker, pain, and nausea control.  Patient instructed to call or go to the ER for increasing pain, fever, or intractable nausea/vomiting.  Follow up in 14 days with KUB.  > 45 minutes were spent in face to face contact with the patient, >50% of which was spent counseling about management options.      Past Surgical History:  Past Surgical History:  Procedure Laterality Date   LITHOTRIPSY     TONSILLECTOMY  2003   TYMPANOSTOMY TUBE PLACEMENT      Gynecologic History:  Patient's last menstrual period was 07/02/2020. Contraception: none  Last Pap: first PAP today  Obstetric History: G0P0000  Family History:  Family History  Problem Relation Age of Onset   Kidney Stones Mother    Hypertension Mother    Kidney Stones Father    Kidney Stones Sister    Breast cancer Maternal Grandmother 73   Hypertension Maternal Grandmother    Brain cancer Maternal Grandfather    Cancer Maternal Grandfather        lung ca - smoker   Brain cancer Paternal Aunt    Breast cancer Other    Breast cancer Other    Breast cancer Other    Hypertension Maternal Aunt    Kidney disease Neg Hx     Social History:  Social History   Socioeconomic History   Marital status: Single    Spouse name: Not on file   Number of children: 0   Years of education: 12   Highest education level: Not on file  Occupational History   Not on file  Tobacco Use   Smoking status: Never   Smokeless tobacco: Never  Vaping Use   Vaping Use: Never used  Substance and Sexual Activity   Alcohol use: No    Alcohol/week: 0.0 standard drinks   Drug use: No   Sexual activity: Yes    Birth control/protection: None  Other Topics Concern   Not on file  Social History Narrative   Not on file   Social Determinants of Health   Financial Resource  Strain: Not on file  Food Insecurity: Not on file  Transportation Needs: Not on file  Physical Activity: Not on file  Stress: Not on file  Social Connections: Not on file  Intimate Partner Violence: Not on file    Allergies:  No Known Allergies  Medications: Prior to Admission medications   Medication Sig Start Date End Date Taking? Authorizing Provider  albuterol (PROVENTIL HFA;VENTOLIN HFA) 108 (90 Base) MCG/ACT inhaler Inhale 2 puffs into the lungs every 6 (six) hours as needed for wheezing or shortness of breath. Patient not taking: Reported on 07/12/2020    [provider]  EPIDUO FORTE 0.3-2.5 % GEL 1 Dose daily.  Patient not taking: Reported on 07/12/2020 06/13/15   [provider]    Physical Exam Vitals: Blood pressure 127/81, height 5\' 6"  (1.676 m), weight 157 lb (71.2 kg), last menstrual period 07/02/2020.  General: NAD HEENT: normocephalic, anicteric Thyroid: no enlargement, no palpable nodules Pulmonary: No increased work of breathing, CTAB Cardiovascular: RRR, distal pulses 2+ Breast: Breast symmetrical, no tenderness, no palpable nodules or masses, no skin or nipple retraction present, no  nipple discharge.  No axillary or supraclavicular lymphadenopathy. Abdomen: NABS, soft, non-tender, non-distended.  Umbilicus without lesions.  No hepatomegaly, splenomegaly or masses palpable. No evidence of hernia  Genitourinary:  External: Normal external female genitalia.  Normal urethral meatus, normal Bartholin's and Skene's glands.    Vagina: Normal vaginal mucosa, no evidence of prolapse.    Cervix: Grossly normal in appearance, no bleeding, no CMT Extremities: no edema, erythema, or tenderness Neurologic: Grossly intact Psychiatric: mood appropriate, affect full   Assessment: 25 y.o. G0P0000 routine annual exam  Plan: Problem List Items Addressed This Visit   None Visit Diagnoses     Well woman exam without gynecological exam    -  Primary    Relevant Orders   Cytology - PAP   RPR Qual   HIV Antibody (routine testing w rflx)   Hepatitis B surface antibody,qualitative   Screening for cervical cancer       Relevant Orders   Cytology - PAP   Screen for sexually transmitted diseases       Relevant Orders   Cytology - PAP   RPR Qual   HIV Antibody (routine testing w rflx)   Hepatitis B surface antibody,qualitative   Need for hepatitis B screening test       Relevant Orders   Hepatitis B surface antibody,qualitative       1) 4) Gardasil Series discussed and if applicable offered to patient - Patient has not previously completed 3 shot series   2) STI screening  was offered and accepted  3)  ASCCP guidelines and rationale discussed.  Patient opts for every 3 years screening interval  4) Contraception - the patient is currently using  none.  She is  in same sex relationship.  We discussed safe sex practices to reduce her furture risk of STI's.    5) Return in about 1 year (around 07/12/2021) for annual established gyn.   Rod Can, [facility] 07/14/2020, 3:47 PM

## 2020-07-17 LAB — CYTOLOGY - PAP
Chlamydia: NEGATIVE
Comment: NEGATIVE
Comment: NEGATIVE
Comment: NORMAL
Diagnosis: NEGATIVE
Neisseria Gonorrhea: NEGATIVE
Trichomonas: NEGATIVE

## 2020-08-02 ENCOUNTER — Encounter: Payer: Self-pay | Admitting: Obstetrics and Gynecology

## 2020-08-15 IMAGING — US RENAL/URINARY TRACT ULTRASOUND
1 series · 14 of 25 positions shown · non-contrast
Comparison: Radiographs dated 03/02/2018 and 02/23/2018 and CT scan
dated 01/19/2014

CLINICAL DATA: Hematuria.  Left ureteral stone.

EXAM:
RENAL / URINARY TRACT ULTRASOUND COMPLETE

[Series 1: renal/urinary tract ultrasound · 0.23mm/px · 14 of 46 slices shown]
[im 1/46]
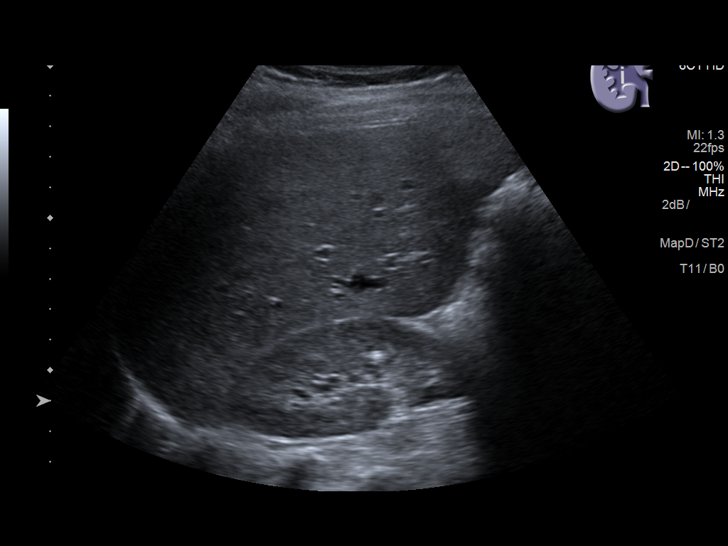
[im 4/46]
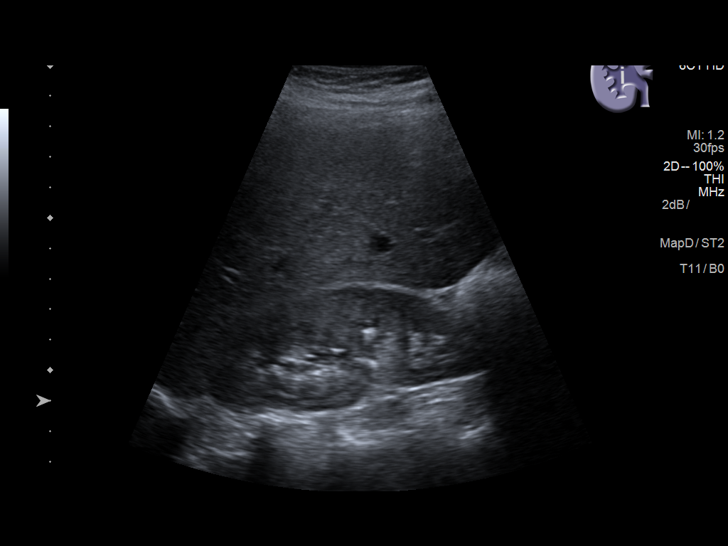
[im 8/46]
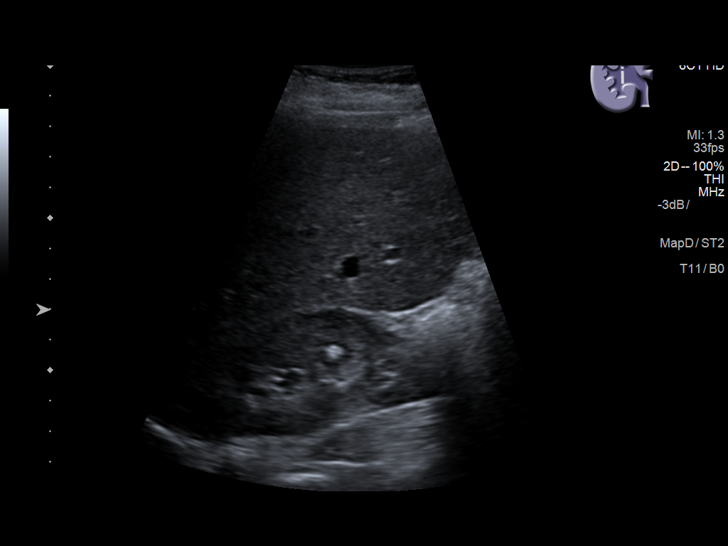
[im 12/46]
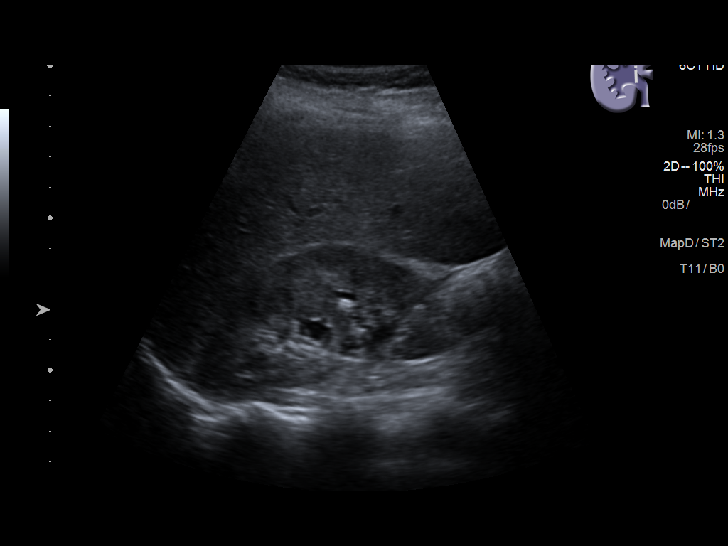
[im 16/46]
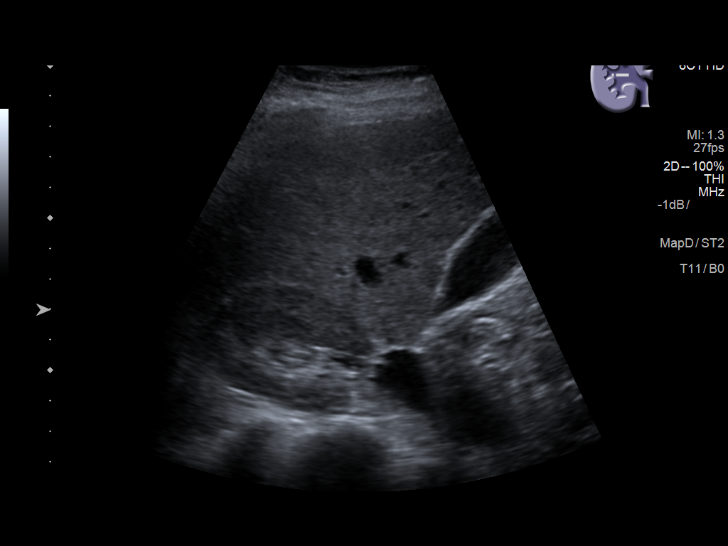
[im 17/46]
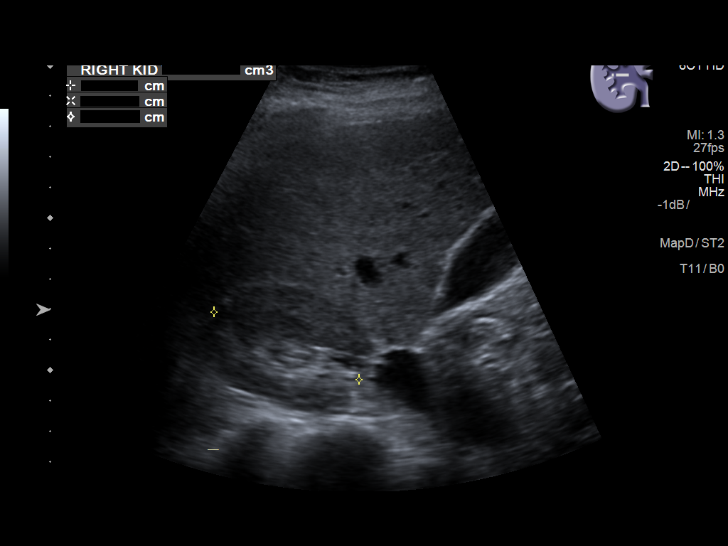
[im 21/46]
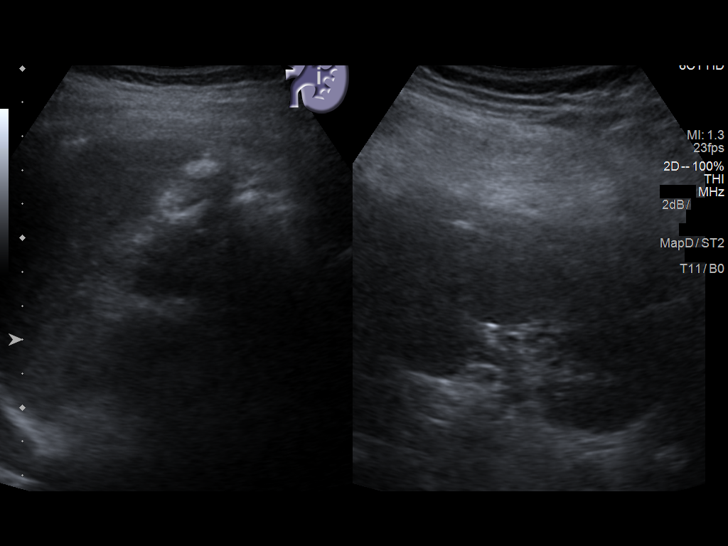
[im 25/46]
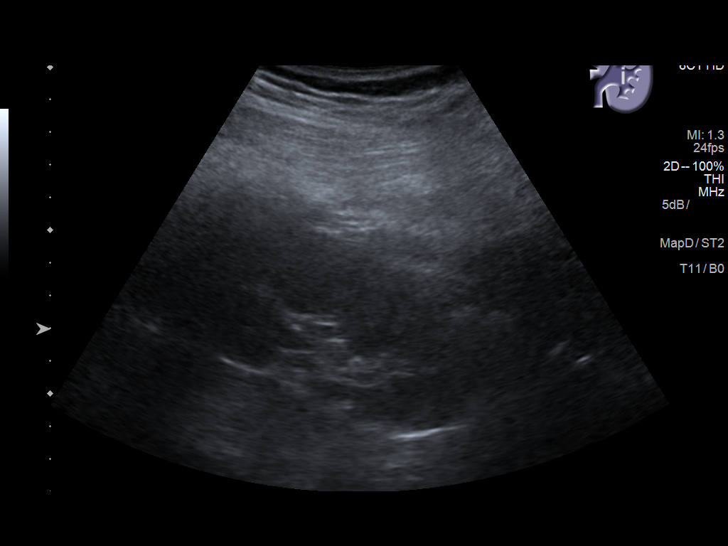
[im 29/46]
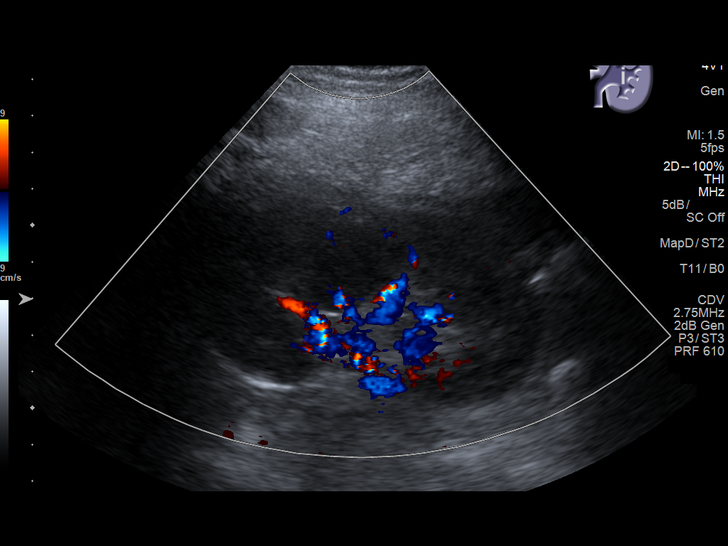
[im 31/46]
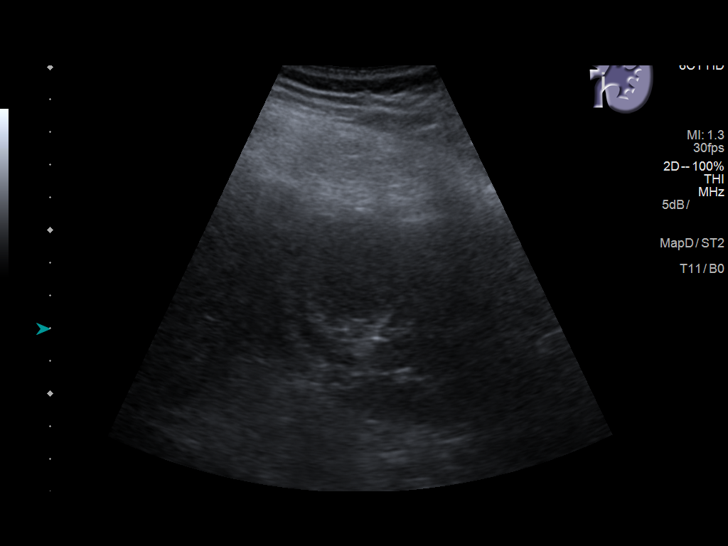
[im 34/46]
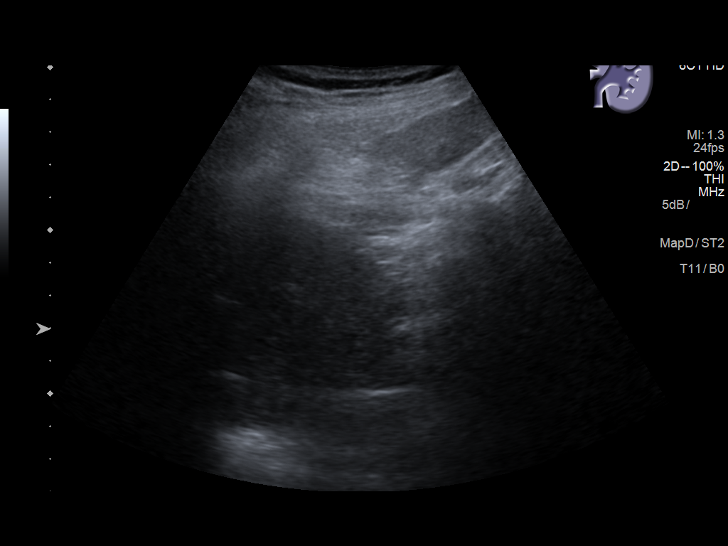
[im 38/46]
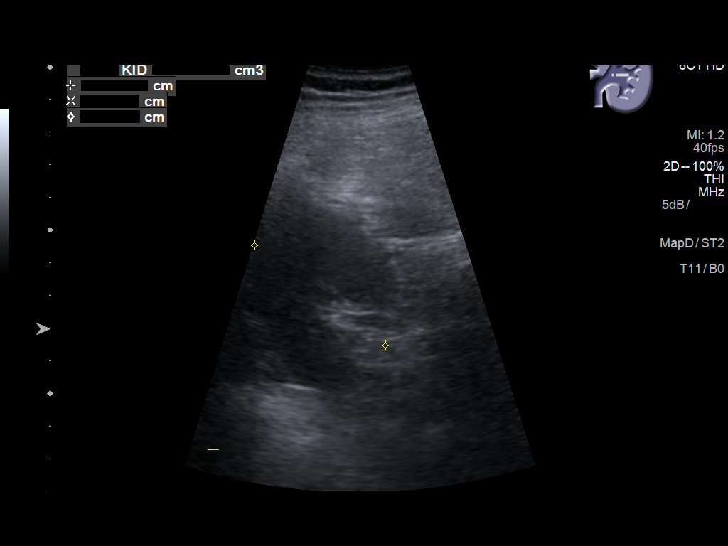
[im 42/46]
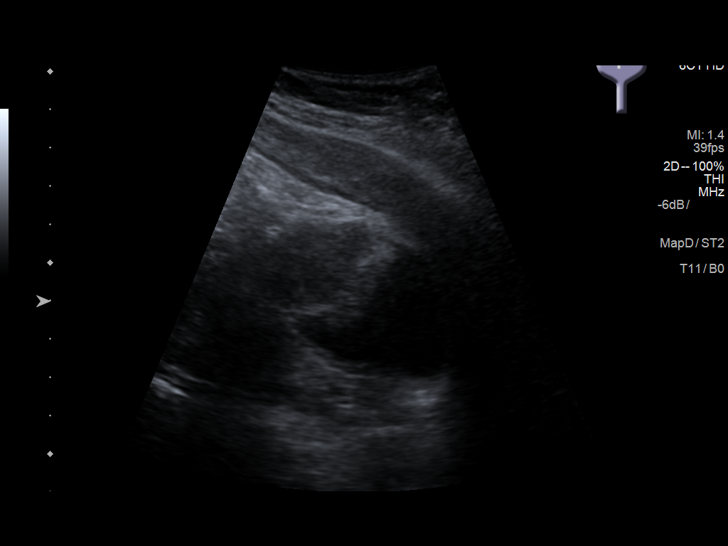
[im 46/46]
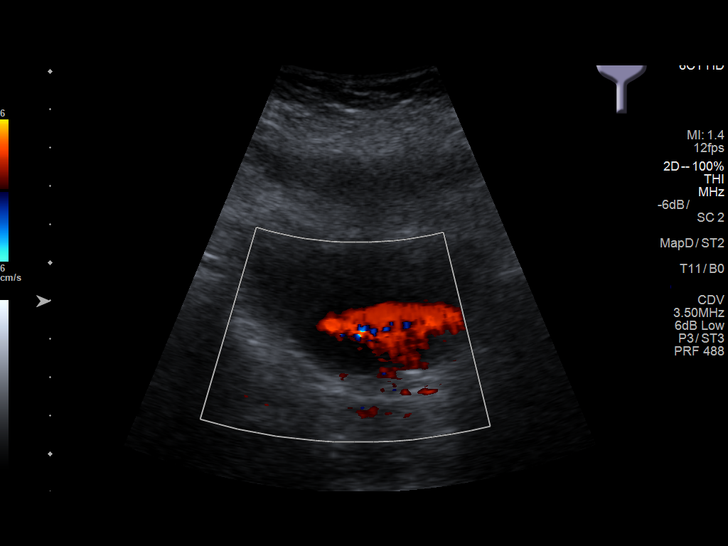

[14 of 25 positions shown; findings below may reference images not displayed]

FINDINGS: Right Kidney:

Renal measurements: 9.8 x 4.1 x 5.3 cm = volume: 109 mL .
Echogenicity within normal limits. No mass or hydronephrosis
visualized. There are multiple tiny echogenic foci in the kidney the
largest being 6.3 mm, consistent with small stones.

Left Kidney:

Renal measurements: 10.4 x 5.0 x 5.0 cm = volume: 136 mL.
Echogenicity within normal limits. No mass or hydronephrosis
visualized.

Bladder:

Left ureteral jet identified. The right ureteral jet is not
identified.
IMPRESSION: 1. No evidence of hydronephrosis of either kidney.
2. Multiple small stones in the right kidney.
3. Right ureteral jet is not identified.

## 2020-08-23 ENCOUNTER — Encounter: Payer: Self-pay | Admitting: Obstetrics and Gynecology

## 2020-08-23 ENCOUNTER — Ambulatory Visit: Payer: Managed Care, Other (non HMO) | Admitting: Obstetrics and Gynecology

## 2020-08-23 ENCOUNTER — Other Ambulatory Visit: Payer: Self-pay

## 2020-08-23 VITALS — BP 110/80 | Ht 65.0 in | Wt 156.0 lb

## 2020-08-23 DIAGNOSIS — B9689 Other specified bacterial agents as the cause of diseases classified elsewhere: Secondary | ICD-10-CM

## 2020-08-23 DIAGNOSIS — N760 Acute vaginitis: Secondary | ICD-10-CM | POA: Diagnosis not present

## 2020-08-23 LAB — POCT WET PREP WITH KOH
Clue Cells Wet Prep HPF POC: POSITIVE
KOH Prep POC: POSITIVE — AB
Trichomonas, UA: NEGATIVE
Yeast Wet Prep HPF POC: NEGATIVE

## 2020-08-23 MED ORDER — METRONIDAZOLE 500 MG PO TABS: 500.0000 mg | ORAL_TABLET | Freq: Two times a day (BID) | ORAL | 0 refills | Status: DC

## 2020-08-23 NOTE — Progress Notes (Signed)
Patient, No Pcp Per (Inactive)   Chief Complaint  Patient presents with   Vaginal Odor    Abnormal odor, little itchiness , no discharge x 1 week    HPI:      Ms. Kristen Duarte is a 25 y.o. G0P0000 whose LMP was Patient's last menstrual period was 08/15/2020 (exact date)., presents today for vaginal odor with mild irritation, no increased d/c for the past wk. No meds to treat, no hx of BV. Did change soaps recently, no recent abx use. No urin sx, LBP, pelvic pain, fevers. She is sex active, no new partners. Neg pap/STD testing 7/22   Past Medical History:  Diagnosis Date   Asthma    Chlamydia    Dermoid cyst    Eczema    Family history of breast cancer    7/22 cancer genetic testing letter sent   Flank pain    Hematuria    gross   History of kidney stones    H/O   Immunization, viral disease 03/03/2012   gardasil series completed   Kidney stones     Past Surgical History:  Procedure Laterality Date   LITHOTRIPSY     TONSILLECTOMY  2003   TYMPANOSTOMY TUBE PLACEMENT      Family History  Problem Relation Age of Onset   Kidney Stones Mother    Hypertension Mother    Kidney Stones Father    Kidney Stones Sister    Hypertension Maternal Aunt    Brain cancer Paternal Aunt    Breast cancer Maternal Grandmother 46   Hypertension Maternal Grandmother    Brain cancer Maternal Grandfather    Cancer Maternal Grandfather        lung ca - smoker   Breast cancer Other    Breast cancer Other    Breast cancer Other    Kidney disease Neg Hx     Social History   Socioeconomic History   Marital status: Single    Spouse name: Not on file   Number of children: 0   Years of education: 12   Highest education level: Not on file  Occupational History   Not on file  Tobacco Use   Smoking status: Never   Smokeless tobacco: Never  Vaping Use   Vaping Use: Never used  Substance and Sexual Activity   Alcohol use: No    Alcohol/week: 0.0 standard drinks   Drug use: No    Sexual activity: Yes    Birth control/protection: None  Other Topics Concern   Not on file  Social History Narrative   Not on file   Social Determinants of Health   Financial Resource Strain: Not on file  Food Insecurity: Not on file  Transportation Needs: Not on file  Physical Activity: Not on file  Stress: Not on file  Social Connections: Not on file  Intimate Partner Violence: Not on file    Outpatient Medications Prior to Visit  Medication Sig Dispense Refill   albuterol (PROVENTIL HFA;VENTOLIN HFA) 108 (90 Base) MCG/ACT inhaler Inhale 2 puffs into the lungs every 6 (six) hours as needed for wheezing or shortness of breath.     EPIDUO FORTE 0.3-2.5 % GEL 1 Dose daily.     No facility-administered medications prior to visit.      ROS:  Review of Systems  Constitutional:  Negative for fever.  Gastrointestinal:  Negative for blood in stool, constipation, diarrhea, nausea and vomiting.  Genitourinary:  Negative for dyspareunia, dysuria, flank pain, frequency,  hematuria, urgency, vaginal bleeding, vaginal discharge and vaginal pain.  Musculoskeletal:  Negative for back pain.  Skin:  Negative for rash.  BREAST: No symptoms   OBJECTIVE:   Vitals:  BP 110/80   Ht '5\' 5"'$  (1.651 m)   Wt 156 lb (70.8 kg)   LMP 08/15/2020 (Exact Date)   BMI 25.96 kg/m   Physical Exam Vitals reviewed.  Constitutional:      Appearance: She is well-developed.  Pulmonary:     Effort: Pulmonary effort is normal.  Genitourinary:    General: Normal vulva.     Pubic Area: No rash.      Labia:        Right: No rash, tenderness or lesion.        Left: No rash, tenderness or lesion.      Vagina: Normal. No vaginal discharge, erythema or tenderness.     Cervix: Normal.     Uterus: Normal. Not enlarged and not tender.      Adnexa: Right adnexa normal and left adnexa normal.       Right: No mass or tenderness.         Left: No mass or tenderness.    Musculoskeletal:        General:  Normal range of motion.     Cervical back: Normal range of motion.  Skin:    General: Skin is warm and dry.  Neurological:     General: No focal deficit present.     Mental Status: She is alert and oriented to person, place, and time.  Psychiatric:        Mood and Affect: Mood normal.        Behavior: Behavior normal.        Thought Content: Thought content normal.        Judgment: Judgment normal.    Results: Results for orders placed or performed in visit on 08/23/20 (from the past 24 hour(s))  POCT Wet Prep with KOH     Status: Abnormal   Collection Time: 08/23/20 11:18 AM  Result Value Ref Range   Trichomonas, UA Negative    Clue Cells Wet Prep HPF POC pos    Epithelial Wet Prep HPF POC     Yeast Wet Prep HPF POC neg    Bacteria Wet Prep HPF POC     RBC Wet Prep HPF POC     WBC Wet Prep HPF POC     KOH Prep POC Positive (A) Negative     Assessment/Plan: BV (bacterial vaginosis) - Plan: metroNIDAZOLE (FLAGYL) 500 MG tablet, POCT Wet Prep with KOH; pos sx and wet prep. Rx flagyl, no EtOH. Most likely due to change in soap. Do Dove sens skin soap, Will RF if sx recur. F/u prn.    Meds ordered this encounter  Medications   metroNIDAZOLE (FLAGYL) 500 MG tablet    Sig: Take 1 tablet (500 mg total) by mouth 2 (two) times daily for 7 days.    Dispense:  14 tablet    Refill:  0    Order Specific Question:   Supervising Provider    Answer:   Gae Dry [id number]      Return if symptoms worsen or fail to improve.  Makaylie Dedeaux B. Yahaira Bruski, PA-C 08/23/2020 11:19 AM

## 2020-08-29 ENCOUNTER — Ambulatory Visit: Payer: Managed Care, Other (non HMO) | Admitting: Obstetrics and Gynecology

## 2020-09-04 ENCOUNTER — Ambulatory Visit: Payer: Managed Care, Other (non HMO) | Admitting: Obstetrics and Gynecology

## 2020-09-28 ENCOUNTER — Ambulatory Visit: Payer: Managed Care, Other (non HMO) | Admitting: Obstetrics and Gynecology

## 2020-10-03 ENCOUNTER — Other Ambulatory Visit: Payer: Self-pay | Admitting: Obstetrics and Gynecology

## 2020-10-03 ENCOUNTER — Ambulatory Visit: Payer: Managed Care, Other (non HMO) | Admitting: Obstetrics and Gynecology

## 2020-10-03 DIAGNOSIS — B9689 Other specified bacterial agents as the cause of diseases classified elsewhere: Secondary | ICD-10-CM

## 2020-10-03 MED ORDER — METRONIDAZOLE 500 MG PO TABS: 500.0000 mg | ORAL_TABLET | Freq: Two times a day (BID) | ORAL | 0 refills | Status: AC

## 2020-10-03 NOTE — Progress Notes (Deleted)
Patient, No Pcp Per (Inactive)   No chief complaint on file.   HPI:      Ms. Kristen Duarte is a 25 y.o. G0P0000 whose LMP was No LMP recorded., presents today for *** Treated for BV 8/22 after change in soap with flagyl  She admits her cycle has gotten shorter and is around 21 days. She declines hormonal control. She is in same sex relationship and therefore declines contraception.  Past Medical History:  Diagnosis Date   Asthma    Chlamydia    Dermoid cyst    Eczema    Family history of breast cancer    7/22 cancer genetic testing letter sent   Flank pain    Hematuria    gross   History of kidney stones    H/O   Immunization, viral disease 03/03/2012   gardasil series completed   Kidney stones     Past Surgical History:  Procedure Laterality Date   LITHOTRIPSY     TONSILLECTOMY  2003   TYMPANOSTOMY TUBE PLACEMENT      Family History  Problem Relation Age of Onset   Kidney Stones Mother    Hypertension Mother    Kidney Stones Father    Kidney Stones Sister    Hypertension Maternal Aunt    Brain cancer Paternal Aunt    Breast cancer Maternal Grandmother 9   Hypertension Maternal Grandmother    Brain cancer Maternal Grandfather    Cancer Maternal Grandfather        lung ca - smoker   Breast cancer Other    Breast cancer Other    Breast cancer Other    Kidney disease Neg Hx     Social History   Socioeconomic History   Marital status: Single    Spouse name: Not on file   Number of children: 0   Years of education: 12   Highest education level: Not on file  Occupational History   Not on file  Tobacco Use   Smoking status: Never   Smokeless tobacco: Never  Vaping Use   Vaping Use: Never used  Substance and Sexual Activity   Alcohol use: No    Alcohol/week: 0.0 standard drinks   Drug use: No   Sexual activity: Yes    Birth control/protection: None  Other Topics Concern   Not on file  Social History Narrative   Not on file   Social  Determinants of Health   Financial Resource Strain: Not on file  Food Insecurity: Not on file  Transportation Needs: Not on file  Physical Activity: Not on file  Stress: Not on file  Social Connections: Not on file  Intimate Partner Violence: Not on file    Outpatient Medications Prior to Visit  Medication Sig Dispense Refill   albuterol (PROVENTIL HFA;VENTOLIN HFA) 108 (90 Base) MCG/ACT inhaler Inhale 2 puffs into the lungs every 6 (six) hours as needed for wheezing or shortness of breath.     EPIDUO FORTE 0.3-2.5 % GEL 1 Dose daily.     No facility-administered medications prior to visit.      ROS:  Review of Systems BREAST: No symptoms   OBJECTIVE:   Vitals:  There were no vitals taken for this visit.  Physical Exam  Results: No results found for this or any previous visit (from the past 24 hour(s)).   Assessment/Plan: No diagnosis found.    No orders of the defined types were placed in this encounter.     No  follow-ups on file.  Eluzer Howdeshell B. Daishaun Ayre, PA-C 10/03/2020 9:56 AM

## 2020-10-03 NOTE — Progress Notes (Signed)
Rx RF flagyl for recurrent BV sx

## 2022-04-14 NOTE — Progress Notes (Unsigned)
PCP:  Patient, No Pcp Per   No chief complaint on file.    HPI:      Ms. Kristen Duarte is a 27 y.o. G0P0000 whose LMP was No LMP recorded., presents today for her annual examination.  Her menses are {norm/abn:715}, lasting {number: 22536} days.  Dysmenorrhea {dysmen:716}. She {does:18564} have intermenstrual bleeding.  Sex activity: {sex active: 315163}.  Last Pap: 07/12/20   Results were: no abnormalities  Hx of STDs: {STD hx:14358} Hx of BV  There is no FH of breast cancer. There is no FH of ovarian cancer. The patient {does:18564} do self-breast exams.  Tobacco use: {tob:20664} Alcohol use: {Alcohol:11675} No drug use.  Exercise: {exercise:31265}  She {does:18564} get adequate calcium and Vitamin D in her diet.  Patient Active Problem List   Diagnosis Date Noted   BV (bacterial vaginosis) 08/23/2020   Left ureteral stone 01/22/2018    Past Surgical History:  Procedure Laterality Date   LITHOTRIPSY     TONSILLECTOMY  2003   TYMPANOSTOMY TUBE PLACEMENT      Family History  Problem Relation Age of Onset   Kidney Stones Mother    Hypertension Mother    Kidney Stones Father    Kidney Stones Sister    Hypertension Maternal Aunt    Brain cancer Paternal Aunt    Breast cancer Maternal Grandmother 45   Hypertension Maternal Grandmother    Brain cancer Maternal Grandfather    Cancer Maternal Grandfather        lung ca - smoker   Breast cancer Other    Breast cancer Other    Breast cancer Other    Kidney disease Neg Hx     Social History   Socioeconomic History   Marital status: Single    Spouse name: Not on file   Number of children: 0   Years of education: 12   Highest education level: Not on file  Occupational History   Not on file  Tobacco Use   Smoking status: Never   Smokeless tobacco: Never  Vaping Use   Vaping Use: Never used  Substance and Sexual Activity   Alcohol use: No    Alcohol/week: 0.0 standard drinks of alcohol   Drug use: No    Sexual activity: Yes    Birth control/protection: None  Other Topics Concern   Not on file  Social History Narrative   Not on file   Social Determinants of Health   Financial Resource Strain: Not on file  Food Insecurity: Not on file  Transportation Needs: Not on file  Physical Activity: Not on file  Stress: Not on file  Social Connections: Not on file  Intimate Partner Violence: Not on file     Current Outpatient Medications:    albuterol (PROVENTIL HFA;VENTOLIN HFA) 108 (90 Base) MCG/ACT inhaler, Inhale 2 puffs into the lungs every 6 (six) hours as needed for wheezing or shortness of breath., Disp: , Rfl:    EPIDUO FORTE 0.3-2.5 % GEL, 1 Dose daily., Disp: , Rfl:      ROS:  Review of Systems BREAST: No symptoms   Objective: There were no vitals taken for this visit.   OBGyn Exam  Results: No results found for this or any previous visit (from the past 24 hour(s)).  Assessment/Plan: No diagnosis found.  No orders of the defined types were placed in this encounter.            GYN counsel {counseling: 16159}     F/U  No follow-ups on file.  Lyndsi Altic B. Jeremy Mclamb, PA-C 04/14/2022 8:01 PM

## 2022-04-15 ENCOUNTER — Ambulatory Visit (INDEPENDENT_AMBULATORY_CARE_PROVIDER_SITE_OTHER): Payer: Managed Care, Other (non HMO) | Admitting: Obstetrics and Gynecology

## 2022-04-15 ENCOUNTER — Encounter: Payer: Self-pay | Admitting: Obstetrics and Gynecology

## 2022-04-15 VITALS — BP 124/70 | Ht 65.0 in | Wt 135.0 lb

## 2022-04-15 DIAGNOSIS — Z01419 Encounter for gynecological examination (general) (routine) without abnormal findings: Secondary | ICD-10-CM

## 2022-04-15 DIAGNOSIS — Z124 Encounter for screening for malignant neoplasm of cervix: Secondary | ICD-10-CM

## 2022-04-15 DIAGNOSIS — N898 Other specified noninflammatory disorders of vagina: Secondary | ICD-10-CM | POA: Diagnosis not present

## 2022-04-15 DIAGNOSIS — Z01411 Encounter for gynecological examination (general) (routine) with abnormal findings: Secondary | ICD-10-CM | POA: Diagnosis not present

## 2022-04-15 DIAGNOSIS — Z113 Encounter for screening for infections with a predominantly sexual mode of transmission: Secondary | ICD-10-CM

## 2022-04-15 DIAGNOSIS — Z803 Family history of malignant neoplasm of breast: Secondary | ICD-10-CM | POA: Insufficient documentation

## 2022-04-15 DIAGNOSIS — Z30014 Encounter for initial prescription of intrauterine contraceptive device: Secondary | ICD-10-CM

## 2022-04-15 LAB — POCT WET PREP WITH KOH
Clue Cells Wet Prep HPF POC: NEGATIVE
KOH Prep POC: NEGATIVE
Trichomonas, UA: NEGATIVE
Yeast Wet Prep HPF POC: NEGATIVE

## 2022-04-15 MED ORDER — MISOPROSTOL 100 MCG PO TABS: 100.0000 ug | ORAL_TABLET | Freq: Once | ORAL | 0 refills | Status: DC

## 2022-04-15 MED ORDER — FLUCONAZOLE 150 MG PO TABS: 150.0000 mg | ORAL_TABLET | Freq: Once | ORAL | 0 refills | Status: AC

## 2022-04-15 NOTE — Patient Instructions (Signed)
I value your feedback and you entrusting us with your care. If you get a [facility] patient survey, I would appreciate you taking the time to let us know about your experience today. Thank you! ? ? ?

## 2022-04-16 NOTE — Addendum Note (Signed)
Addended by: Althea Grimmer B on: 04/16/2022 11:37 AM   Modules accepted: Orders

## 2022-04-24 LAB — HM PAP SMEAR

## 2022-04-25 ENCOUNTER — Encounter: Payer: Self-pay | Admitting: Obstetrics and Gynecology

## 2022-05-21 ENCOUNTER — Encounter: Payer: Self-pay | Admitting: Obstetrics and Gynecology

## 2022-08-30 ENCOUNTER — Ambulatory Visit (INDEPENDENT_AMBULATORY_CARE_PROVIDER_SITE_OTHER): Payer: Managed Care, Other (non HMO)

## 2022-08-30 ENCOUNTER — Other Ambulatory Visit (HOSPITAL_COMMUNITY)
Admission: RE | Admit: 2022-08-30 | Discharge: 2022-08-30 | Disposition: A | Discharge status: Home / Self Care | Payer: Managed Care, Other (non HMO) | Source: Ambulatory Visit | Attending: Obstetrics and Gynecology | Admitting: Obstetrics and Gynecology

## 2022-08-30 VITALS — BP 112/82 | HR 80 | Ht 65.0 in | Wt 142.0 lb

## 2022-08-30 DIAGNOSIS — Z113 Encounter for screening for infections with a predominantly sexual mode of transmission: Secondary | ICD-10-CM | POA: Insufficient documentation

## 2022-08-30 NOTE — Progress Notes (Signed)
    NURSE VISIT NOTE  Subjective:    Patient ID: Kristen Duarte, female    DOB: December 03, 1995, 27 y.o.   MRN: [id number]  HPI  Patient is a 27 y.o. G0P0000 female who presents for A self swab recently had a partner change and would like to make sure she does not have a STD. Pt was told what the vaginal self swab test for and agreed to all. No symptoms or other concerns.   Objective:    BP 112/82   Pulse 80   Ht 5\' 5"  (1.651 m)   Wt 142 lb (64.4 kg)   BMI 23.63 kg/m      Assessment:   1. Screening for STD (sexually transmitted disease)       Plan:   GC and chlamydia DNA  probe sent to lab.  Loney Laurence, CMA

## 2022-09-02 LAB — CERVICOVAGINAL ANCILLARY ONLY
Chlamydia: NEGATIVE
Comment: NEGATIVE
Comment: NORMAL
Neisseria Gonorrhea: NEGATIVE
